# Patient Record
Sex: Female | Born: 1990 | Race: White | Hispanic: No | Smoking: Never smoker
Health system: Southern US, Community
[De-identification: ages and names within clinical notes are randomized; demographics above are authoritative.]

## PROBLEM LIST (undated history)

## (undated) ENCOUNTER — Inpatient Hospital Stay (HOSPITAL_COMMUNITY): Payer: Self-pay

## (undated) DIAGNOSIS — N83209 Unspecified ovarian cyst, unspecified side: Secondary | ICD-10-CM

## (undated) HISTORY — PX: WISDOM TOOTH EXTRACTION: SHX21

---
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
## 2013-11-26 LAB — OB RESULTS CONSOLE ABO/RH: RH TYPE: POSITIVE

## 2013-11-26 LAB — OB RESULTS CONSOLE ANTIBODY SCREEN: Antibody Screen: NEGATIVE

## 2013-11-26 LAB — OB RESULTS CONSOLE HIV ANTIBODY (ROUTINE TESTING): HIV: NONREACTIVE

## 2013-11-26 LAB — OB RESULTS CONSOLE RPR: RPR: NONREACTIVE

## 2013-11-26 LAB — OB RESULTS CONSOLE HEPATITIS B SURFACE ANTIGEN: HEP B S AG: NEGATIVE

## 2013-11-26 LAB — OB RESULTS CONSOLE RUBELLA ANTIBODY, IGM: Rubella: IMMUNE

## 2013-11-28 ENCOUNTER — Encounter (HOSPITAL_COMMUNITY): Payer: Self-pay | Admitting: *Deleted

## 2013-11-28 ENCOUNTER — Inpatient Hospital Stay (HOSPITAL_COMMUNITY)
Admission: AD | Admit: 2013-11-28 | Discharge: 2013-11-28 | Disposition: A | Discharge status: Home / Self Care | Payer: 59 | Source: Ambulatory Visit | Attending: Obstetrics | Admitting: Obstetrics

## 2013-11-28 DIAGNOSIS — O210 Mild hyperemesis gravidarum: Secondary | ICD-10-CM | POA: Insufficient documentation

## 2013-11-28 LAB — URINALYSIS, ROUTINE W REFLEX MICROSCOPIC
Bilirubin Urine: NEGATIVE
GLUCOSE, UA: NEGATIVE mg/dL
HGB URINE DIPSTICK: NEGATIVE
Leukocytes, UA: NEGATIVE
Nitrite: NEGATIVE
PROTEIN: NEGATIVE mg/dL
Specific Gravity, Urine: >1.03 — ABNORMAL HIGH (ref 1.005–1.030)
Urobilinogen, UA: 0.2 mg/dL (ref 0.0–1.0)
pH: 6 (ref 5.0–8.0)

## 2013-11-28 MED ORDER — PROMETHAZINE HCL 25 MG/ML IJ SOLN
25.0000 mg | Freq: Once | INTRAVENOUS | Status: AC
  Administered 2013-11-28: 25 mg via INTRAVENOUS
  Filled 2013-11-28: qty 1

## 2013-11-28 MED ORDER — FAMOTIDINE IN NACL 20-0.9 MG/50ML-% IV SOLN
20.0000 mg | Freq: Once | INTRAVENOUS | Status: AC
  Administered 2013-11-28: 20 mg via INTRAVENOUS
  Filled 2013-11-28: qty 50

## 2013-11-28 MED ORDER — RANITIDINE HCL 150 MG PO TABS: 150.0000 mg | ORAL_TABLET | Freq: Two times a day (BID) | ORAL | Status: DC

## 2013-11-28 MED ORDER — PROMETHAZINE HCL 25 MG RE SUPP: 25.0000 mg | Freq: Four times a day (QID) | RECTAL | Status: DC | PRN

## 2013-11-28 NOTE — Discharge Instructions (Signed)
Hyperemesis Gravidarum  Hyperemesis gravidarum is a severe form of nausea and vomiting that happens during pregnancy. Hyperemesis is worse than morning sickness. It may cause you to have nausea or vomiting all day for many days. It may keep you from eating and drinking enough food and liquids. Hyperemesis usually occurs during the first half (the first 20 weeks) of pregnancy. It often goes away once a woman is in her second half of pregnancy. However, sometimes hyperemesis continues through an entire pregnancy.   CAUSES   The cause of this condition is not completely known but is thought to be related to changes in the body's hormones when pregnant. It could be from the high level of the pregnancy hormone or an increase in estrogen in the body.   SIGNS AND SYMPTOMS   · Severe nausea and vomiting.  · Nausea that does not go away.  · Vomiting that does not allow you to keep any food down.  · Weight loss and body fluid loss (dehydration).  · Having no desire to eat or not liking food you have previously enjoyed.  DIAGNOSIS   Your health care provider will do a physical exam and ask you about your symptoms. He or she may also order blood tests and urine tests to make sure something else is not causing the problem.   TREATMENT   You may only need medicine to control the problem. If medicines do not control the nausea and vomiting, you will be treated in the hospital to prevent dehydration, increased acid in the blood (acidosis), weight loss, and changes in the electrolytes in your body that may harm the unborn baby (fetus). You may need IV fluids.   HOME CARE INSTRUCTIONS   · Only take over-the-counter or prescription medicines as directed by your health care provider.  · Try eating a couple of dry crackers or toast in the morning before getting out of bed.  · Avoid foods and smells that upset your stomach.  · Avoid fatty and spicy foods.  · Eat 5 6 small meals a day.  · Do not drink when eating meals. Drink between  meals.  · For snacks, eat high-protein foods, such as cheese.  · Eat or suck on things that have ginger in them. Ginger helps nausea.  · Avoid food preparation. The smell of food can spoil your appetite.  · Avoid iron pills and iron in your multivitamins until after 3 4 months of being pregnant. However, consult with your health care provider before stopping any prescribed iron pills.  SEEK MEDICAL CARE IF:   · Your abdominal pain increases.  · You have a severe headache.  · You have vision problems.  · You are losing weight.  SEEK IMMEDIATE MEDICAL CARE IF:   · You are unable to keep fluids down.  · You vomit blood.  · You have constant nausea and vomiting.  · You have excessive weakness.  · You have extreme thirst.  · You have dizziness or fainting.  · You have a fever or persistent symptoms for more than 2 3 days.  · You have a fever and your symptoms suddenly get worse.  MAKE SURE YOU:   · Understand these instructions.  · Will watch your condition.  · Will get help right away if you are not doing well or get worse.  Document Released: 07/25/2005 Document Revised: 05/15/2013 Document Reviewed: 03/06/2013  [employer]® Patient Information ©2014 [employer].

## 2013-11-28 NOTE — H&P (Signed)
Chief complaint: Nausea and vomiting  History of present illness: 23 year old G1 at 9 weeks by LMP who presents with one day of persistent nausea and vomiting. Patient has had nausea off and on over the past week. At first tried over-the-counter ginger and vitamin B. When this did not help patient called the office and was prescribed Zofran. Patient took several doses of Premarin with good effect but then was concerned regarding fetal side effects of Zofran and stopped taking this medicine. The patient did not call to seek additional medicine. The patient did well yesterday and tolerated regular dinner. This a.m. patient woke up and initially felt okay but by early morning patient started feeling nausea and began having emesis. Patient reports she has been unable to tolerate any by mouth all day long. She does present at this evening for IV fluids and evaluation. Patient does note constipation. Patient declines heartburn. Patient has not yet been seen by a provider in the office and has not yet had an ultrasound. Patient denies vaginal bleeding or abdominal pain.  Past medical history: None Past surgical history: None allergies: None Medications: Diclegis, prenatal vitamins, rare Zofran use  PE: Filed Vitals:   11/28/13 1929 11/28/13 2318  BP: 123/73 92/47  Pulse: 73 122  Temp: 98.7 F (37.1 C)   TempSrc: Oral   Resp: 18 18  Height: 5\' 2"  (1.575 m)   Weight: 49.442 kg (109 lb)   SpO2: 100%    General: Well-appearing, in no distress Cardiovascular: Regular rate and rhythm Pulmonary: Clear auscultation bilaterally Abdomen soft, nontender, nondistended Back: No costovertebral angle tenderness GU: Deferred Lower extremity: No edema, nontender  Urinalysis    Component Value Date/Time   COLORURINE YELLOW 11/28/2013 1930   APPEARANCEUR CLEAR 11/28/2013 1930   LABSPEC >1.030* 11/28/2013 1930   PHURINE 6.0 11/28/2013 1930   GLUCOSEU NEGATIVE 11/28/2013 1930   HGBUR NEGATIVE 11/28/2013 1930   BILIRUBINUR NEGATIVE 11/28/2013 1930   KETONESUR >80* 11/28/2013 1930   PROTEINUR NEGATIVE 11/28/2013 1930   UROBILINOGEN 0.2 11/28/2013 1930   NITRITE NEGATIVE 11/28/2013 1930   LEUKOCYTESUR NEGATIVE 11/28/2013 1930    MAU course: Patient received 1 L of IV fluids with IV Phenergan and Zantac. She was then observed over several hours time was able to tolerate by mouth liquids and solids. Exam was benign and patient was discharged home with education and additional medicines for at home use  Assessment and plan: 23 year old G1 at 609 weeks by Oswald HillockAlan P. who presents with nausea vomiting and dehydration. Patient is improved on discharge after IV hydration. Discussed with patient risk and benefits of Zofran and recommended limited use of Zofran when unable to tolerate any by mouth in order to avoid trip to hospital for IV hydration. In the future we'll start with higher protein diet, slow hydration, likely just, Phenergan suppository if needed and Zantac. Patient agrees.  Early pregnancy. Patient has appointment on Tuesday for ultrasound to confirm live IUP by ultrasound. No concerning physical exam findings to suggest other wise   Tresa EndoKelly A. Odin Mariani 11/28/2013 11:37 PM

## 2013-11-28 NOTE — MAU Note (Signed)
Pt reports she has been unable to keep anything down today. Has had vomiting through out pregnancy, started new meds yesterday but not working today.

## 2013-12-03 LAB — OB RESULTS CONSOLE GC/CHLAMYDIA
Chlamydia: NEGATIVE
Gonorrhea: NEGATIVE

## 2014-06-09 ENCOUNTER — Encounter (HOSPITAL_COMMUNITY): Payer: Self-pay | Admitting: *Deleted

## 2014-06-25 LAB — OB RESULTS CONSOLE GBS: STREP GROUP B AG: NEGATIVE

## 2014-07-03 ENCOUNTER — Inpatient Hospital Stay (HOSPITAL_COMMUNITY): Payer: 59 | Admitting: Anesthesiology

## 2014-07-03 ENCOUNTER — Encounter (HOSPITAL_COMMUNITY): Payer: Self-pay

## 2014-07-03 ENCOUNTER — Inpatient Hospital Stay (HOSPITAL_COMMUNITY)
Admission: AD | Admit: 2014-07-03 | Discharge: 2014-07-07 | DRG: 765 | Disposition: A | Discharge status: Home / Self Care | Payer: 59 | Source: Ambulatory Visit | Attending: Obstetrics & Gynecology | Admitting: Obstetrics & Gynecology

## 2014-07-03 DIAGNOSIS — O9902 Anemia complicating childbirth: Secondary | ICD-10-CM | POA: Diagnosis present

## 2014-07-03 DIAGNOSIS — O471 False labor at or after 37 completed weeks of gestation: Secondary | ICD-10-CM | POA: Diagnosis present

## 2014-07-03 DIAGNOSIS — O2653 Maternal hypotension syndrome, third trimester: Secondary | ICD-10-CM | POA: Diagnosis not present

## 2014-07-03 DIAGNOSIS — O864 Pyrexia of unknown origin following delivery: Secondary | ICD-10-CM | POA: Diagnosis not present

## 2014-07-03 DIAGNOSIS — Z8249 Family history of ischemic heart disease and other diseases of the circulatory system: Secondary | ICD-10-CM | POA: Diagnosis not present

## 2014-07-03 DIAGNOSIS — Z3A39 39 weeks gestation of pregnancy: Secondary | ICD-10-CM | POA: Diagnosis present

## 2014-07-03 DIAGNOSIS — D62 Acute posthemorrhagic anemia: Secondary | ICD-10-CM | POA: Diagnosis not present

## 2014-07-03 DIAGNOSIS — K219 Gastro-esophageal reflux disease without esophagitis: Secondary | ICD-10-CM | POA: Diagnosis present

## 2014-07-03 DIAGNOSIS — IMO0001 Reserved for inherently not codable concepts without codable children: Secondary | ICD-10-CM

## 2014-07-03 DIAGNOSIS — O99613 Diseases of the digestive system complicating pregnancy, third trimester: Secondary | ICD-10-CM | POA: Diagnosis present

## 2014-07-03 HISTORY — DX: Unspecified ovarian cyst, unspecified side: N83.209

## 2014-07-03 LAB — CBC
HCT: 35.4 % — ABNORMAL LOW (ref 36.0–46.0)
Hemoglobin: 12 g/dL (ref 12.0–15.0)
MCH: 29.6 pg (ref 26.0–34.0)
MCHC: 33.9 g/dL (ref 30.0–36.0)
MCV: 87.2 fL (ref 78.0–100.0)
Platelets: 218 10*3/uL (ref 150–400)
RBC: 4.06 MIL/uL (ref 3.87–5.11)
RDW: 14.5 % (ref 11.5–15.5)
WBC: 18.2 10*3/uL — ABNORMAL HIGH (ref 4.0–10.5)

## 2014-07-03 LAB — TYPE AND SCREEN
ABO/RH(D): A POS
ANTIBODY SCREEN: NEGATIVE

## 2014-07-03 MED ORDER — PHENYLEPHRINE 40 MCG/ML (10ML) SYRINGE FOR IV PUSH (FOR BLOOD PRESSURE SUPPORT)
80.0000 ug | PREFILLED_SYRINGE | INTRAVENOUS | Status: DC | PRN
  Filled 2014-07-03 (×2): qty 10

## 2014-07-03 MED ORDER — FLEET ENEMA 7-19 GM/118ML RE ENEM: 1.0000 | ENEMA | RECTAL | Status: DC | PRN

## 2014-07-03 MED ORDER — OXYCODONE-ACETAMINOPHEN 5-325 MG PO TABS: 1.0000 | ORAL_TABLET | ORAL | Status: DC | PRN

## 2014-07-03 MED ORDER — OXYCODONE-ACETAMINOPHEN 5-325 MG PO TABS: 2.0000 | ORAL_TABLET | ORAL | Status: DC | PRN

## 2014-07-03 MED ORDER — FENTANYL 2.5 MCG/ML BUPIVACAINE 1/10 % EPIDURAL INFUSION (WH - ANES)
14.0000 mL/h | INTRAMUSCULAR | Status: DC | PRN
  Administered 2014-07-03 – 2014-07-04 (×2): 14 mL/h via EPIDURAL
  Filled 2014-07-03 (×2): qty 125

## 2014-07-03 MED ORDER — OXYTOCIN BOLUS FROM INFUSION: 500.0000 mL | INTRAVENOUS | Status: DC

## 2014-07-03 MED ORDER — LACTATED RINGERS IV SOLN
500.0000 mL | INTRAVENOUS | Status: DC | PRN
  Administered 2014-07-04 (×2): 500 mL via INTRAVENOUS

## 2014-07-03 MED ORDER — CITRIC ACID-SODIUM CITRATE 334-500 MG/5ML PO SOLN
30.0000 mL | ORAL | Status: DC | PRN
  Administered 2014-07-04: 30 mL via ORAL
  Filled 2014-07-03: qty 15

## 2014-07-03 MED ORDER — OXYTOCIN 40 UNITS IN LACTATED RINGERS INFUSION - SIMPLE MED: 62.5000 mL/h | INTRAVENOUS | Status: DC

## 2014-07-03 MED ORDER — PHENYLEPHRINE 40 MCG/ML (10ML) SYRINGE FOR IV PUSH (FOR BLOOD PRESSURE SUPPORT)
80.0000 ug | PREFILLED_SYRINGE | INTRAVENOUS | Status: DC | PRN
  Administered 2014-07-04 (×2): 80 ug via INTRAVENOUS

## 2014-07-03 MED ORDER — ONDANSETRON HCL 4 MG/2ML IJ SOLN
4.0000 mg | Freq: Four times a day (QID) | INTRAMUSCULAR | Status: DC | PRN
  Filled 2014-07-03: qty 2

## 2014-07-03 MED ORDER — EPHEDRINE 5 MG/ML INJ: 10.0000 mg | INTRAVENOUS | Status: DC | PRN

## 2014-07-03 MED ORDER — LIDOCAINE HCL (PF) 1 % IJ SOLN
INTRAMUSCULAR | Status: DC | PRN
  Administered 2014-07-03 (×4): 4 mL

## 2014-07-03 MED ORDER — LACTATED RINGERS IV SOLN
INTRAVENOUS | Status: DC
  Administered 2014-07-04 (×3): via INTRAVENOUS

## 2014-07-03 MED ORDER — LACTATED RINGERS IV SOLN
500.0000 mL | Freq: Once | INTRAVENOUS | Status: AC
  Administered 2014-07-03: 500 mL via INTRAVENOUS

## 2014-07-03 MED ORDER — ACETAMINOPHEN 325 MG PO TABS
650.0000 mg | ORAL_TABLET | ORAL | Status: DC | PRN
  Administered 2014-07-04: 650 mg via ORAL
  Filled 2014-07-03: qty 2

## 2014-07-03 MED ORDER — DIPHENHYDRAMINE HCL 50 MG/ML IJ SOLN: 12.5000 mg | INTRAMUSCULAR | Status: DC | PRN

## 2014-07-03 MED ORDER — LIDOCAINE HCL (PF) 1 % IJ SOLN: 30.0000 mL | INTRAMUSCULAR | Status: DC | PRN

## 2014-07-03 NOTE — Anesthesia Procedure Notes (Signed)
Epidural Patient location during procedure: OB Start time: 07/03/2014 10:36 PM  Staffing Anesthesiologist: Shaylie Eklund Performed by: anesthesiologist   Preanesthetic Checklist Completed: patient identified, site marked, surgical consent, pre-op evaluation, timeout performed, IV checked, risks and benefits discussed and monitors and equipment checked  Epidural Patient position: sitting Prep: site prepped and draped and DuraPrep Patient monitoring: continuous pulse ox and blood pressure Approach: midline Location: L3-L4 Injection technique: LOR air  Needle:  Needle type: Tuohy  Needle gauge: 17 G Needle length: 9 cm and 9 Needle insertion depth: 4 cm Catheter type: closed end flexible Catheter size: 19 Gauge Catheter at skin depth: 9 cm Test dose: negative  Assessment Events: blood not aspirated, injection not painful, no injection resistance, negative IV test and no paresthesia  Additional Notes Discussed risk of headache, infection, bleeding, nerve injury and failed or incomplete block.  Patient voices understanding and wishes to proceed.  Epidural placed easily on second attempt (due to positioning/cooperation).  No paresthesia.  Patient tolerated procedure relatively well with support from husband.  No apparent complications.  Jasmine DecemberA. Valton Schwartz, MDReason for block:procedure for pain

## 2014-07-03 NOTE — MAU Note (Signed)
Pt states that she is having contractions every 2 minutes since 1730 today. Denies SROM/LOF, states positive fetal movement, denies bright red bleeding, states positive "spotting". Denies any complications with pregnancy.

## 2014-07-03 NOTE — Anesthesia Preprocedure Evaluation (Signed)
Anesthesia Evaluation  Patient identified by MRN, date of birth, ID band Patient awake    Reviewed: Allergy & Precautions, H&P , NPO status , Patient's Chart, lab work & pertinent test results, reviewed documented beta blocker date and time   History of Anesthesia Complications Negative for: history of anesthetic complications  Airway Mallampati: I  TM Distance: >3 FB Neck ROM: full    Dental  (+) Teeth Intact   Pulmonary neg pulmonary ROS,  breath sounds clear to auscultation        Cardiovascular negative cardio ROS  Rhythm:regular Rate:Normal     Neuro/Psych negative neurological ROS  negative psych ROS   GI/Hepatic negative GI ROS, Neg liver ROS, GERD-  Medicated,  Endo/Other  negative endocrine ROS  Renal/GU negative Renal ROS     Musculoskeletal   Abdominal   Peds  Hematology negative hematology ROS (+)   Anesthesia Other Findings   Reproductive/Obstetrics (+) Pregnancy                             Anesthesia Physical Anesthesia Plan  ASA: II  Anesthesia Plan: Epidural   Post-op Pain Management:    Induction:   Airway Management Planned:   Additional Equipment:   Intra-op Plan:   Post-operative Plan:   Informed Consent: I have reviewed the patients History and Physical, chart, labs and discussed the procedure including the risks, benefits and alternatives for the proposed anesthesia with the patient or authorized representative who has indicated his/her understanding and acceptance.     Plan Discussed with:   Anesthesia Plan Comments:         Anesthesia Quick Evaluation

## 2014-07-03 NOTE — MAU Note (Signed)
[facility] given to Leanne, [facility]. Strip reviewed. Pt may transfer to room 168.

## 2014-07-03 NOTE — MAU Note (Signed)
Pt in room. Pt ambulating to BR

## 2014-07-04 ENCOUNTER — Encounter (HOSPITAL_COMMUNITY): Payer: Self-pay | Admitting: Certified Registered Nurse Anesthetist

## 2014-07-04 ENCOUNTER — Encounter (HOSPITAL_COMMUNITY)
Admission: AD | Disposition: A | Discharge status: Home / Self Care | Payer: Self-pay | Source: Ambulatory Visit | Attending: Obstetrics & Gynecology

## 2014-07-04 DIAGNOSIS — IMO0001 Reserved for inherently not codable concepts without codable children: Secondary | ICD-10-CM

## 2014-07-04 LAB — HIV ANTIBODY (ROUTINE TESTING W REFLEX): HIV 1&2 Ab, 4th Generation: NONREACTIVE

## 2014-07-04 LAB — ABO/RH: ABO/RH(D): A POS

## 2014-07-04 LAB — RPR

## 2014-07-04 SURGERY — Surgical Case
Anesthesia: Epidural

## 2014-07-04 MED ORDER — DEXTROSE 5 % IV SOLN
1.0000 ug/kg/h | INTRAVENOUS | Status: DC | PRN
  Filled 2014-07-04: qty 2

## 2014-07-04 MED ORDER — SODIUM CHLORIDE 0.9 % IJ SOLN
3.0000 mL | INTRAMUSCULAR | Status: DC | PRN
Start: 2014-07-04 — End: 2014-07-05

## 2014-07-04 MED ORDER — OXYTOCIN 40 UNITS IN LACTATED RINGERS INFUSION - SIMPLE MED: 62.5000 mL/h | INTRAVENOUS | Status: DC

## 2014-07-04 MED ORDER — SCOPOLAMINE 1 MG/3DAYS TD PT72
MEDICATED_PATCH | TRANSDERMAL | Status: AC
  Administered 2014-07-04: 1.5 mg via TRANSDERMAL
  Filled 2014-07-04: qty 1

## 2014-07-04 MED ORDER — NALBUPHINE HCL 10 MG/ML IJ SOLN: 5.0000 mg | INTRAMUSCULAR | Status: DC | PRN

## 2014-07-04 MED ORDER — HYDROMORPHONE HCL 1 MG/ML IJ SOLN: 0.2500 mg | INTRAMUSCULAR | Status: DC | PRN

## 2014-07-04 MED ORDER — IBUPROFEN 600 MG PO TABS
600.0000 mg | ORAL_TABLET | Freq: Four times a day (QID) | ORAL | Status: DC
  Administered 2014-07-04 – 2014-07-07 (×12): 600 mg via ORAL
  Filled 2014-07-04 (×12): qty 1

## 2014-07-04 MED ORDER — CEFAZOLIN SODIUM-DEXTROSE 2-3 GM-% IV SOLR
2.0000 g | Freq: Three times a day (TID) | INTRAVENOUS | Status: DC
  Administered 2014-07-04 – 2014-07-05 (×3): 2 g via INTRAVENOUS
  Filled 2014-07-04 (×3): qty 50

## 2014-07-04 MED ORDER — FENTANYL CITRATE 0.05 MG/ML IJ SOLN
INTRAMUSCULAR | Status: DC | PRN
  Administered 2014-07-04: 100 ug via EPIDURAL

## 2014-07-04 MED ORDER — SCOPOLAMINE 1 MG/3DAYS TD PT72
1.0000 | MEDICATED_PATCH | Freq: Once | TRANSDERMAL | Status: AC
  Administered 2014-07-04: 1.5 mg via TRANSDERMAL

## 2014-07-04 MED ORDER — CEFAZOLIN SODIUM-DEXTROSE 2-3 GM-% IV SOLR
INTRAVENOUS | Status: DC | PRN
  Administered 2014-07-04: 2 g via INTRAVENOUS

## 2014-07-04 MED ORDER — SODIUM BICARBONATE 8.4 % IV SOLN
INTRAVENOUS | Status: DC | PRN
  Administered 2014-07-04 (×2): 5 mL via EPIDURAL

## 2014-07-04 MED ORDER — ONDANSETRON HCL 4 MG PO TABS
4.0000 mg | ORAL_TABLET | ORAL | Status: DC | PRN
Start: 2014-07-04 — End: 2014-07-05

## 2014-07-04 MED ORDER — FENTANYL CITRATE 0.05 MG/ML IJ SOLN
INTRAMUSCULAR | Status: AC
  Filled 2014-07-04: qty 2

## 2014-07-04 MED ORDER — PRENATAL MULTIVITAMIN CH
1.0000 | ORAL_TABLET | Freq: Every day | ORAL | Status: DC
  Administered 2014-07-05 – 2014-07-06 (×2): 1 via ORAL
  Filled 2014-07-04 (×2): qty 1

## 2014-07-04 MED ORDER — MEPERIDINE HCL 25 MG/ML IJ SOLN
INTRAMUSCULAR | Status: DC | PRN
  Administered 2014-07-04 (×2): 12.5 mg via INTRAVENOUS

## 2014-07-04 MED ORDER — MORPHINE SULFATE (PF) 0.5 MG/ML IJ SOLN
INTRAMUSCULAR | Status: DC | PRN
  Administered 2014-07-04: 3 mg via EPIDURAL
  Administered 2014-07-04: 2 mg via EPIDURAL

## 2014-07-04 MED ORDER — DIPHENHYDRAMINE HCL 25 MG PO CAPS: 25.0000 mg | ORAL_CAPSULE | ORAL | Status: DC | PRN

## 2014-07-04 MED ORDER — LANOLIN HYDROUS EX OINT: 1.0000 "application " | TOPICAL_OINTMENT | CUTANEOUS | Status: DC | PRN

## 2014-07-04 MED ORDER — KETOROLAC TROMETHAMINE 30 MG/ML IJ SOLN
INTRAMUSCULAR | Status: AC
  Administered 2014-07-04: 30 mg via INTRAMUSCULAR
  Filled 2014-07-04: qty 1

## 2014-07-04 MED ORDER — DIPHENHYDRAMINE HCL 25 MG PO CAPS
25.0000 mg | ORAL_CAPSULE | Freq: Four times a day (QID) | ORAL | Status: DC | PRN
Start: 2014-07-04 — End: 2014-07-07

## 2014-07-04 MED ORDER — SIMETHICONE 80 MG PO CHEW
80.0000 mg | CHEWABLE_TABLET | ORAL | Status: DC
  Administered 2014-07-05 – 2014-07-07 (×3): 80 mg via ORAL
  Filled 2014-07-04 (×3): qty 1

## 2014-07-04 MED ORDER — OXYTOCIN 10 UNIT/ML IJ SOLN
40.0000 [IU] | INTRAVENOUS | Status: DC | PRN
  Administered 2014-07-04: 40 [IU] via INTRAVENOUS

## 2014-07-04 MED ORDER — KETOROLAC TROMETHAMINE 30 MG/ML IJ SOLN: 15.0000 mg | Freq: Once | INTRAMUSCULAR | Status: DC | PRN

## 2014-07-04 MED ORDER — ONDANSETRON HCL 4 MG/2ML IJ SOLN
INTRAMUSCULAR | Status: AC
  Filled 2014-07-04: qty 2

## 2014-07-04 MED ORDER — PHENYLEPHRINE 8 MG IN D5W 100 ML (0.08MG/ML) PREMIX OPTIME
INJECTION | INTRAVENOUS | Status: AC
  Filled 2014-07-04: qty 100

## 2014-07-04 MED ORDER — KETOROLAC TROMETHAMINE 30 MG/ML IJ SOLN
30.0000 mg | Freq: Four times a day (QID) | INTRAMUSCULAR | Status: DC | PRN
  Administered 2014-07-04: 30 mg via INTRAMUSCULAR

## 2014-07-04 MED ORDER — MEPERIDINE HCL 25 MG/ML IJ SOLN
INTRAMUSCULAR | Status: AC
  Filled 2014-07-04: qty 1

## 2014-07-04 MED ORDER — FAMOTIDINE 20 MG PO TABS
20.0000 mg | ORAL_TABLET | Freq: Every day | ORAL | Status: DC
  Administered 2014-07-05 – 2014-07-07 (×3): 20 mg via ORAL
  Filled 2014-07-04 (×3): qty 1

## 2014-07-04 MED ORDER — NALBUPHINE HCL 10 MG/ML IJ SOLN: 5.0000 mg | Freq: Once | INTRAMUSCULAR | Status: AC | PRN

## 2014-07-04 MED ORDER — OXYCODONE-ACETAMINOPHEN 5-325 MG PO TABS
1.0000 | ORAL_TABLET | ORAL | Status: DC | PRN
  Administered 2014-07-05 – 2014-07-07 (×4): 1 via ORAL
  Filled 2014-07-04 (×4): qty 1

## 2014-07-04 MED ORDER — CEFAZOLIN SODIUM-DEXTROSE 2-3 GM-% IV SOLR
2.0000 g | INTRAVENOUS | Status: DC
  Filled 2014-07-04: qty 50

## 2014-07-04 MED ORDER — ONDANSETRON HCL 4 MG/2ML IJ SOLN: 4.0000 mg | INTRAMUSCULAR | Status: DC | PRN

## 2014-07-04 MED ORDER — CHLOROPROCAINE HCL 3 % IJ SOLN
INTRAMUSCULAR | Status: AC
  Filled 2014-07-04: qty 20

## 2014-07-04 MED ORDER — TERBUTALINE SULFATE 1 MG/ML IJ SOLN
INTRAMUSCULAR | Status: AC
  Filled 2014-07-04: qty 1

## 2014-07-04 MED ORDER — WITCH HAZEL-GLYCERIN EX PADS: 1.0000 "application " | MEDICATED_PAD | CUTANEOUS | Status: DC | PRN

## 2014-07-04 MED ORDER — LACTATED RINGERS IV SOLN
INTRAVENOUS | Status: DC | PRN
  Administered 2014-07-04: 10:00:00 via INTRAVENOUS

## 2014-07-04 MED ORDER — DIPHENHYDRAMINE HCL 50 MG/ML IJ SOLN
12.5000 mg | INTRAMUSCULAR | Status: DC | PRN
Start: 2014-07-04 — End: 2014-07-05

## 2014-07-04 MED ORDER — SENNOSIDES-DOCUSATE SODIUM 8.6-50 MG PO TABS
2.0000 | ORAL_TABLET | ORAL | Status: DC
  Administered 2014-07-05 – 2014-07-07 (×3): 2 via ORAL
  Filled 2014-07-04 (×3): qty 2

## 2014-07-04 MED ORDER — OXYCODONE-ACETAMINOPHEN 5-325 MG PO TABS: 2.0000 | ORAL_TABLET | ORAL | Status: DC | PRN

## 2014-07-04 MED ORDER — METHYLERGONOVINE MALEATE 0.2 MG/ML IJ SOLN
INTRAMUSCULAR | Status: DC | PRN
  Administered 2014-07-04: 0.2 mg via INTRAMUSCULAR

## 2014-07-04 MED ORDER — MORPHINE SULFATE 0.5 MG/ML IJ SOLN
INTRAMUSCULAR | Status: AC
  Filled 2014-07-04: qty 10

## 2014-07-04 MED ORDER — CHLOROPROCAINE HCL 3 % IJ SOLN
INTRAMUSCULAR | Status: DC | PRN
  Administered 2014-07-04: 20 mL

## 2014-07-04 MED ORDER — TETANUS-DIPHTH-ACELL PERTUSSIS 5-2.5-18.5 LF-MCG/0.5 IM SUSP: 0.5000 mL | Freq: Once | INTRAMUSCULAR | Status: DC

## 2014-07-04 MED ORDER — MEPERIDINE HCL 25 MG/ML IJ SOLN: 6.2500 mg | INTRAMUSCULAR | Status: DC | PRN

## 2014-07-04 MED ORDER — OXYTOCIN 10 UNIT/ML IJ SOLN
INTRAMUSCULAR | Status: AC
  Filled 2014-07-04: qty 4

## 2014-07-04 MED ORDER — PHENYLEPHRINE HCL 10 MG/ML IJ SOLN
INTRAMUSCULAR | Status: DC | PRN
  Administered 2014-07-04 (×2): 40 ug via INTRAVENOUS

## 2014-07-04 MED ORDER — ONDANSETRON HCL 4 MG/2ML IJ SOLN
INTRAMUSCULAR | Status: DC | PRN
  Administered 2014-07-04: 4 mg via INTRAVENOUS

## 2014-07-04 MED ORDER — IBUPROFEN 600 MG PO TABS: 600.0000 mg | ORAL_TABLET | Freq: Four times a day (QID) | ORAL | Status: DC | PRN

## 2014-07-04 MED ORDER — PROMETHAZINE HCL 25 MG/ML IJ SOLN: 6.2500 mg | INTRAMUSCULAR | Status: DC | PRN

## 2014-07-04 MED ORDER — MENTHOL 3 MG MT LOZG: 1.0000 | LOZENGE | OROMUCOSAL | Status: DC | PRN

## 2014-07-04 MED ORDER — NALOXONE HCL 0.4 MG/ML IJ SOLN: 0.4000 mg | INTRAMUSCULAR | Status: DC | PRN

## 2014-07-04 MED ORDER — ZOLPIDEM TARTRATE 5 MG PO TABS
5.0000 mg | ORAL_TABLET | Freq: Every evening | ORAL | Status: DC | PRN
Start: 2014-07-04 — End: 2014-07-05

## 2014-07-04 MED ORDER — LACTATED RINGERS IV SOLN
INTRAVENOUS | Status: DC
  Administered 2014-07-04: 21:00:00 via INTRAVENOUS

## 2014-07-04 MED ORDER — SIMETHICONE 80 MG PO CHEW: 80.0000 mg | CHEWABLE_TABLET | ORAL | Status: DC | PRN

## 2014-07-04 MED ORDER — KETOROLAC TROMETHAMINE 30 MG/ML IJ SOLN: 30.0000 mg | Freq: Four times a day (QID) | INTRAMUSCULAR | Status: DC | PRN

## 2014-07-04 MED ORDER — DIBUCAINE 1 % RE OINT: 1.0000 "application " | TOPICAL_OINTMENT | RECTAL | Status: DC | PRN

## 2014-07-04 MED ORDER — SIMETHICONE 80 MG PO CHEW
80.0000 mg | CHEWABLE_TABLET | Freq: Three times a day (TID) | ORAL | Status: DC
  Administered 2014-07-04 – 2014-07-07 (×8): 80 mg via ORAL
  Filled 2014-07-04 (×8): qty 1

## 2014-07-04 MED ORDER — ONDANSETRON HCL 4 MG/2ML IJ SOLN
4.0000 mg | Freq: Three times a day (TID) | INTRAMUSCULAR | Status: DC | PRN
Start: 2014-07-04 — End: 2014-07-05

## 2014-07-04 MED ORDER — TERBUTALINE SULFATE 1 MG/ML IJ SOLN
0.2500 mg | Freq: Once | INTRAMUSCULAR | Status: AC
  Administered 2014-07-04: 0.25 mg via SUBCUTANEOUS

## 2014-07-04 SURGICAL SUPPLY — 40 items
APL SKNCLS STERI-STRIP NONHPOA (GAUZE/BANDAGES/DRESSINGS) ×1
BENZOIN TINCTURE PRP APPL 2/3 (GAUZE/BANDAGES/DRESSINGS) ×2 IMPLANT
CLAMP CORD UMBIL (MISCELLANEOUS) IMPLANT
CLOSURE WOUND 1/2 X4 (GAUZE/BANDAGES/DRESSINGS)
CLOTH BEACON ORANGE TIMEOUT ST (SAFETY) ×3 IMPLANT
CONTAINER PREFILL 10% NBF 15ML (MISCELLANEOUS) IMPLANT
DRAPE SHEET LG 3/4 BI-LAMINATE (DRAPES) IMPLANT
DRSG OPSITE POSTOP 4X10 (GAUZE/BANDAGES/DRESSINGS) ×3 IMPLANT
DURAPREP 26ML APPLICATOR (WOUND CARE) ×3 IMPLANT
ELECT REM PT RETURN 9FT ADLT (ELECTROSURGICAL) ×3
ELECTRODE REM PT RTRN 9FT ADLT (ELECTROSURGICAL) ×1 IMPLANT
EXTRACTOR VACUUM KIWI (MISCELLANEOUS) IMPLANT
EXTRACTOR VACUUM M CUP 4 TUBE (SUCTIONS) IMPLANT
EXTRACTOR VACUUM M CUP 4' TUBE (SUCTIONS)
GLOVE BIO SURGEON STRL SZ7 (GLOVE) ×3 IMPLANT
GLOVE BIOGEL PI IND STRL 7.0 (GLOVE) ×1 IMPLANT
GLOVE BIOGEL PI INDICATOR 7.0 (GLOVE) ×2
GOWN STRL REUS W/TWL LRG LVL3 (GOWN DISPOSABLE) ×6 IMPLANT
KIT ABG SYR 3ML LUER SLIP (SYRINGE) IMPLANT
NDL HYPO 25X5/8 SAFETYGLIDE (NEEDLE) IMPLANT
NEEDLE HYPO 25X5/8 SAFETYGLIDE (NEEDLE) IMPLANT
NS IRRIG 1000ML POUR BTL (IV SOLUTION) ×3 IMPLANT
PACK C SECTION WH (CUSTOM PROCEDURE TRAY) ×3 IMPLANT
PAD OB MATERNITY 4.3X12.25 (PERSONAL CARE ITEMS) ×3 IMPLANT
RTRCTR C-SECT PINK 25CM LRG (MISCELLANEOUS) IMPLANT
STAPLER VISISTAT 35W (STAPLE) IMPLANT
STRIP CLOSURE SKIN 1/2X4 (GAUZE/BANDAGES/DRESSINGS) IMPLANT
SUT MON AB-0 CT1 36 (SUTURE) ×9 IMPLANT
SUT PLAIN 0 NONE (SUTURE) IMPLANT
SUT PLAIN 2 0 (SUTURE)
SUT PLAIN ABS 2-0 CT1 27XMFL (SUTURE) IMPLANT
SUT VIC AB 0 CT1 27 (SUTURE) ×6
SUT VIC AB 0 CT1 27XBRD ANBCTR (SUTURE) ×2 IMPLANT
SUT VIC AB 2-0 CT1 27 (SUTURE) ×6
SUT VIC AB 2-0 CT1 TAPERPNT 27 (SUTURE) ×2 IMPLANT
SUT VIC AB 4-0 KS 27 (SUTURE) ×2 IMPLANT
SUT VICRYL 0 TIES 12 18 (SUTURE) IMPLANT
TOWEL OR 17X24 6PK STRL BLUE (TOWEL DISPOSABLE) ×3 IMPLANT
TRAY FOLEY CATH 14FR (SET/KITS/TRAYS/PACK) IMPLANT
WATER STERILE IRR 1000ML POUR (IV SOLUTION) ×3 IMPLANT

## 2014-07-04 NOTE — Op Note (Addendum)
Cesarean Section Procedure Note   Kari Wilkerson 07/04/2014  Indications: 39.5 wks, active labor,  Arrest of dilatation and decent, persistent right OP position   Pre-operative Diagnosis: Primary Cesarean Section for arrest of descent.   Post-operative Diagnosis: Same, Direct occiput posterior position  Surgeon: Robley FriesVaishali R Elisavet Buehrer, MD   Assistants: None   Anesthesia: epidural   Procedure Details:  The patient was seen in the Labor Room. Patient had arrest of decent at -1 station and anterior lip persisted. Cesarean delivery was recommended, the risks, benefits, complications, treatment options, and expected outcomes were discussed with the patient. The patient concurred with the proposed plan, giving informed consent. identified as Kari Wilkerson and the procedure verified as C-Section Delivery. A Time Out was held and the above information confirmed.  After induction of anesthesia, the patient was draped and prepped in the usual sterile manner. Foley catheter was draining well. A Pfannenstiel incision  was made and carried down through the subcutaneous tissue to the fascia. Fascial incision was made and extended transversely. The fascia was separated from the underlying rectus tissue superiorly and inferiorly. The peritoneum was identified and entered. Peritoneal incision was extended longitudinally. Alexis-O retractor was placed.  The utero-vesical peritoneal reflection was incised transversely and a low transverse uterine incision was made. Delivered from direct OP position was a healthy FEMALE infant at 10.22 am on 07/04/2014 with Apgar scores of 8 at one minute and 9 at five minutes. Weight 7 lb 6 oz. Cord clamped and cut, baby handed to NICU team. Cord ph was not sent, cord blood was obtained for evaluation. The placenta was removed Intact and appeared normal. The uterine outline, tubes and ovaries appeared normal. Uterine atony noted despite pitocin, so Methergine 0.2mg  IM given. The uterine  incision was closed with running locked sutures of 0Monocryl followed by a second imbricating layer.    Hemostasis was observed. Alexis retractor was removed. Peritoneal closure done with 2-0 Vicryl.  The fascia was then reapproximated with running sutures of 0Vicryl. The subcuticular closure was performed using 4-0Vicryl. Steri-strips and honeycomb dressing placed.   Instrument, sponge, and needle counts were correct prior the abdominal closure and were correct at the conclusion of the case.   Findings: FEMALE infant delivered from direct OP position at 10.22 am on 07/04/2014 via Sharl MaKerr hysterotomy. Apgars 8 and 9.  2 layer closure of hysterotomy. Uterine atony controlled with IM Methergine. Normal tubes and ovaries.    Estimated Blood Loss: 1200 cc  Total IV Fluids: 2300 cc LR   Urine Output: 1200 ccCC OF clear urine in foley  Specimens: Cord blood, placenta  Complications: no complications  Disposition: PACU - hemodynamically stable.   Maternal Condition: stable   Baby condition / location:  Couplet care / Skin to Skin  Attending Attestation: I performed the procedure.   Signed: Surgeon(s): Robley FriesVaishali R Iasia Forcier, MD

## 2014-07-04 NOTE — Plan of Care (Signed)
Problem: Phase I Progression Outcomes Goal: OOB as tolerated unless otherwise ordered Outcome: Completed/Met Date Met:  07/04/14

## 2014-07-04 NOTE — Progress Notes (Signed)
Patient ID: Kari Wilkerson, female   DOB: [birth year], 23 y.o.   MRN: [id number] Subjective: Doing well,  Epidural working well, denies pelvic pressure  Objective: BP 125/80 mmHg  Pulse 106  Temp(Src) 99.2 F (37.3 C) (Oral)  Resp 18  Ht 5\' 2"  (1.575 m)  Wt 146 lb (66.225 kg)  BMI 26.70 kg/m2  SpO2 100%  FHT:  FHR: 150 bpm, variability: moderate,  accelerations:  Present,  decelerations:  Absent  UC:   regular, every 2 minutes SVE:   Dilation: Lip/rim Effacement (%): 100 Station: -1 Exam by:: Dr. Juliene PinaMody Station unchanged, with only caput in the pelvic inlet. Persists ROT. Persistent luip of cervix and failed decent.   Assessment / Plan: Arrest of decent  Fetal Wellbeing:  Category I Pain Control:  Epidural  Anticipated MOD:  Proceed with  c-section.  Risks/complications of surgery reviewed incl infection, bleeding, damage to internal organs including bladder, bowels, ureters, blood vessels, other risks from anesthesia, VTE and delayed complications of any surgery, complications in future surgery reviewed. Also discussed neonatal complications incl difficult delivery, laceration, vacuum assistance, TTN etc. Pt understands and agrees, all concerns addressed.     Journei Thomassen R 07/04/2014, 9:55 AM

## 2014-07-04 NOTE — Progress Notes (Signed)
[name] is a 23 y.o. G1P0 at [id number], well dated by 1st trimester sono c/w dates. Admitted in labor and has dilated well. But decent is not noted. Baby stays in persistent ROT position with caput at -2-1. UCs by external monitor and are adequate with spontaneous dilatation to 8 cm.  VS stable, low grade temp resolved with Tylenol.  FHT noted change in baseline from 140 to 150s, recurrent variable decels with late component with contractions off and on and resolve. Variability moderate. So category II that resolves to category I.   Watch rotation and decent, narrow pelvis with higher possibility of c-section.   Rai Severns R 07/04/2014, 7:23 AM

## 2014-07-04 NOTE — Plan of Care (Signed)
Problem: Phase II Progression Outcomes Goal: Tolerating diet Outcome: Completed/Met Date Met:  07/04/14     

## 2014-07-04 NOTE — Anesthesia Postprocedure Evaluation (Signed)
  Anesthesia Post-op Note  Patient: Kari Wilkerson  Procedure(s) Performed: Procedure(s): CESAREAN SECTION (N/A)  Patient Location: PACU and Mother/Baby  Anesthesia Type:Epidural  Level of Consciousness: awake, alert , oriented and patient cooperative  Airway and Oxygen Therapy: Patient Spontanous Breathing  Post-op Pain: none  Post-op Assessment: Post-op Vital signs reviewed, Patient's Cardiovascular Status Stable, Respiratory Function Stable, Patent Airway, No signs of Nausea or vomiting, Adequate PO intake, Pain level controlled, No headache, No backache, No residual numbness and No residual motor weakness  Post-op Vital Signs: Reviewed and stable  Last Vitals:  Filed Vitals:   07/04/14 1604  BP: 113/56  Pulse: 96  Temp:   Resp:     Complications: No apparent anesthesia complications

## 2014-07-04 NOTE — Anesthesia Postprocedure Evaluation (Signed)
Anesthesia Post Note  Patient: Kari Wilkerson  Procedure(s) Performed: Procedure(s) (LRB): CESAREAN SECTION (N/A)  Anesthesia type: Epidural  Patient location: PACU  Post pain: Pain level controlled  Post assessment: Post-op Vital signs reviewed  Last Vitals:  Filed Vitals:   07/04/14 1215  BP: 112/71  Pulse: 80  Temp: 37.5 C  Resp: 23    Post vital signs: Reviewed  Level of consciousness: awake  Complications: No apparent anesthesia complications

## 2014-07-04 NOTE — Addendum Note (Signed)
Addendum  created 07/04/14 1726 by Orlie Pollenebra R Finneas Mathe, CRNA   Modules edited: Notes Section   Notes Section:  File: [id number]

## 2014-07-04 NOTE — Plan of Care (Signed)
Problem: Phase I Progression Outcomes Goal: Other Phase I Outcomes/Goals Outcome: Not Applicable Date Met:  91/66/06

## 2014-07-04 NOTE — Progress Notes (Signed)
Patient ID: [name], female   DOB: [birth year], 23 y.o.   MRN: [id number] Came to assess FHT due to repetitive late decels but variability maintained. UCs off and on tachysystole. Terbutaline inj 0.25 x 1 [place] given.  UCs spaced out and FHT improved to 150s/ no decels/ mod variab- category I  Cx swollen per RN, recheck in 2 hrs from last.

## 2014-07-04 NOTE — Lactation Note (Signed)
This note was copied from the chart of Kari Tennis ShipLindsay O'Katherleen Folkes. Lactation Consultation Note  Patient Name: Kari Wilkerson ZOXWR'UToday's Date: 07/04/2014 Reason for consult: Initial assessment of this mom and baby at 10 hours pp.  FOB present and encouraging mom with her breastfeeding.  Baby fed for 45 minutes at initial breastfeeding and both parents report that he latches easily and mom denies any nipple discomfort.  Initial LATCH score assessment=8 per RN and baby has already fed 4 times since delivery.  LC encouraged frequent STS and cue feedings.  Mom says she has been shown hand expression by her nurse.   Mom encouraged to feed baby 8-12 times/24 hours and with feeding cues. LC encouraged review of Baby and Me pp 9, 14 and 20-25 for STS and BF information. LC provided [employer] Resource brochure and reviewed [facility] services and list of community and web site resources.   Maternal Data Formula Feeding for Exclusion: No Has patient been taught Hand Expression?: Yes Does the patient have breastfeeding experience prior to this delivery?: No  Feeding    LATCH Score/Interventions              initial LATCH score=8 per RN assessment        Lactation Tools Discussed/Used   STS, cue feedings, hand expression Signs of proper latch  Consult Status Consult Status: Follow-up Date: 07/05/14 Follow-up type: In-patient    Kari Wilkerson, [facility] Wil[facility]erson [facility] 07/04/2014, 8:55 PM

## 2014-07-04 NOTE — H&P (Addendum)
[name] is a 23 y.o. female presenting at 39.4 wks with contractions, in active labor. Good FMs, no leaking or bleeding. SROM with moderate meconium per RN after admission  Uncomplicated pregnancy.  History OB History    Gravida Para Term Preterm AB TAB SAB Ectopic Multiple Living   1              Past Medical History  Diagnosis Date  . Ovarian cyst    History reviewed. No pertinent past surgical history. Family History: family history includes Hypertension in her father. Social History:  reports that she has never smoked. She has never used smokeless tobacco. She reports that she does not drink alcohol or use illicit drugs.   Prenatal Transfer Tool  Maternal Diabetes: No Genetic Screening: Declined, AFP1 normal  Maternal Ultrasounds/Referrals: Normal Fetal Ultrasounds or other Referrals:  None Maternal Substance Abuse:  No Significant Maternal Medications:  None Significant Maternal Lab Results:  Lab values include: Group B Strep negative Other Comments:  Maternal anemia    ROS  Neg   Dilation: 4 Effacement (%): 100 Station: -2 Exam by:: Dr Juliene PinaMody Blood pressure 94/47, pulse 87, temperature 98.7 F (37.1 C), temperature source Oral, resp. rate 18, height 5\' 2"  (1.575 m), weight 146 lb (66.225 kg), SpO2 100 %. Exam Physical Exam   A&O x 3, no acute distress. Pleasant HEENT neg, no thyromegaly Lungs CTA bilat CV RRR, A1S2 normal Abdo soft, non tender, non acute Extr no edema/ tenderness Pelvic 4/100%/-2/ BBOW (forebag), AROM, bloody fluid. Pelvic narrow FHT  145/ + accels/ no decels/ mod variab, category I Toco q 2 min, spontaneous labor.   Prenatal labs: ABO, Rh: --/--/A POS (11/26 2005) Antibody: NEG (11/26 2005) Rubella: Immune (04/21 0000) RPR: Nonreactive (04/21 0000)  HBsAg: Negative (04/21 0000)  HIV: Non-reactive (04/21 0000)  GBS: Negative (11/18 0000)   Assessment/Plan: 23 yo, G1 at 39.4 wks, in early active labor. Admit, epidural ok. FHT -  category I. EFW 7 lbs, borderline pelvis. Assess progress.    Kari Wilkerson R 07/04/2014, 1:02 AM

## 2014-07-04 NOTE — Transfer of Care (Signed)
Immediate Anesthesia Transfer of Care Note  Patient: Kari Wilkerson  Procedure(s) Performed: Procedure(s): CESAREAN SECTION (N/A)  Patient Location: PACU  Anesthesia Type:Epidural  Level of Consciousness: awake, alert , oriented and patient cooperative  Airway & Oxygen Therapy: Patient Spontanous Breathing  Post-op Assessment: Report given to PACU RN and Post -op Vital signs reviewed and stable  Post vital signs: Reviewed and stable  Complications: No apparent anesthesia complications

## 2014-07-04 NOTE — Plan of Care (Signed)
Problem: Phase I Progression Outcomes Goal: Pain controlled with appropriate interventions Outcome: Completed/Met Date Met:  07/04/14 Goal: Foley catheter patent Outcome: Completed/Met Date Met:  07/04/14 Goal: IS, TCDB as ordered Outcome: Completed/Met Date Met:  07/04/14 Goal: VS, stable, temp < 100.4 degrees F Outcome: Completed/Met Date Met:  07/04/14 Goal: Initial discharge plan identified Outcome: Completed/Met Date Met:  07/04/14

## 2014-07-04 NOTE — Progress Notes (Signed)
Patient ID: Kari Wilkerson, female   DOB: 07-03-1991, 23 y.o.   MRN: [id number] Subjective: Doing well,  Epidural working well, denies pelvic pressure  Objective: BP 133/70 mmHg  Pulse 93  Temp(Src) 99.2 F (37.3 C) (Oral)  Resp 18  Ht 5\' 2"  (1.575 m)  Wt 146 lb (66.225 kg)  BMI 26.70 kg/m2  SpO2 100%  FHT:  FHR: 150 bpm, variability: moderate,  accelerations:  Present,  decelerations:  Absent (decels resolved as long as pt stays on her back and return once she is turned to her side) UC:   regular, every 2 minutes SVE:   Dilation: Lip/rim Effacement (%): 100 Station: -1 Exam by:: Dr. Juliene Wilkerson Station unchanged, with only caput in the pelvic inlet. Persists ROT  Assessment / Plan: Arrest of decent  Fetal Wellbeing:  Category I Pain Control:  Epidural  Anticipated MOD:  Very likely a c-section delivery since almost complete with no decent. Recheck in 1 hr   Kari Wilkerson R 07/04/2014, 8:16 AM

## 2014-07-05 LAB — CBC
HCT: 24.9 % — ABNORMAL LOW (ref 36.0–46.0)
Hemoglobin: 8.4 g/dL — ABNORMAL LOW (ref 12.0–15.0)
MCH: 29.9 pg (ref 26.0–34.0)
MCHC: 33.7 g/dL (ref 30.0–36.0)
MCV: 88.6 fL (ref 78.0–100.0)
PLATELETS: 158 10*3/uL (ref 150–400)
RBC: 2.81 MIL/uL — ABNORMAL LOW (ref 3.87–5.11)
RDW: 15 % (ref 11.5–15.5)
WBC: 20.9 10*3/uL — AB (ref 4.0–10.5)

## 2014-07-05 MED ORDER — LACTATED RINGERS IV SOLN: Freq: Once | INTRAVENOUS | Status: DC

## 2014-07-05 MED ORDER — INFLUENZA VAC SPLIT QUAD 0.5 ML IM SUSY: 0.5000 mL | PREFILLED_SYRINGE | INTRAMUSCULAR | Status: DC | PRN

## 2014-07-05 MED ORDER — MAGNESIUM OXIDE 400 (241.3 MG) MG PO TABS
200.0000 mg | ORAL_TABLET | Freq: Every day | ORAL | Status: DC
  Administered 2014-07-05 – 2014-07-07 (×3): 200 mg via ORAL
  Filled 2014-07-05 (×3): qty 0.5

## 2014-07-05 MED ORDER — POLYSACCHARIDE IRON COMPLEX 150 MG PO CAPS
150.0000 mg | ORAL_CAPSULE | Freq: Every day | ORAL | Status: DC
  Administered 2014-07-05 – 2014-07-07 (×3): 150 mg via ORAL
  Filled 2014-07-05 (×3): qty 1

## 2014-07-05 NOTE — Progress Notes (Signed)
POSTOPERATIVE DAY # 1 S/P C/S for Arrest of dilatation and decent, persistent right OP position    S:        Reports feeling good, some soreness with activity, slept intermittently throughout the night             No dizziness or weakness with standing or walking             Tolerating po intake / no nausea / no vomiting / + flatus / no BM             Bleeding is light             Pain controlled withprescription NSAID's including ibuprofen (Motrin)             Up ad lib / ambulatory/ has voided x 1 since foley has been out   Newborn breast feeding going well/ Circumcision - Dr. Juliene PinaMody planning 11/29             Per RN - Pecola LeisureBaby has low platelets, rechecking platelets and bilirubin today    O:  VS: BP 87/35 mmHg  Pulse 64  Temp(Src) 98.5 F (36.9 C) (Oral)  Resp 18  Ht 5\' 2"  (1.575 m)  Wt 66.225 kg (146 lb)  BMI 26.70 kg/m2  SpO2 98%  Breastfeeding? Unknown   LABS:               Recent Labs  07/03/14 2005 07/05/14 0552  WBC 18.2* 20.9*  HGB 12.0 8.4*  PLT 218 158               Bloodtype: --/--/A POS, A POS (11/26 2005)  Rubella: Immune (04/21 0000)                                             I&O: Intake/Output      11/27 0701 - 11/28 0700 11/28 0701 - 11/29 0700   P.O. 1080 240   I.V. (mL/kg) 2500 (37.8)    Total Intake(mL/kg) 3580 (54.1) 240 (3.6)   Urine (mL/kg/hr) 1960 (1.2) 650 (4.3)   Blood 1250 (0.8)    Total Output 3210 650   Net +370 -410                     Physical Exam:             Alert and Oriented X3  Lungs: Clear and unlabored  Heart: regular rate and rhythm / no mumurs  Abdomen: soft, non-tender, non-distended, active bowel sounds in all 4 quadrants             Fundus: firm, non-tender, U-1             Dressing: honey comb dressing clean, dry, intact              Incision:  approximated with sutures / no erythema / no ecchymosis / no drainage  Perineum: intact  Lochia: scant, red bleeding, no clots noted  Extremities: no edema, no calf pain or  tenderness, negative Homans bilaterally, SCDs on bilaterally  A:        POD # 1 S/P C/S for arrest of dilatation and descent, persistent OP position             ABL Anemia - begin Niferex today  S/P Uterine Atony - Methergine given in OR - stable            Hypotension overnight - 300cc IVF bolus given - stable, not symptomatic            Postpartum Fever - Ancef x 3 doses given - stable, afebrile   P:        Routine postoperative care              Niferex 150mg  daily, increase to BID at discharge x 2 weeks, then daily x 4 weeks              Magnesium Oxide 200mg  daily, then BID at discharge x 2 weeks, then daily x 4 weeks with iron             Offer flu vaccine              D/C antibiotics after last dose of Ancef today, then D/C IV site             Instructed to empty bladder every 2-3 hours to prevent bladder distention              Comfort measures for flatus discussed             Possible discharge tomorrow afternoon                Kari Wilkerson, [place]

## 2014-07-05 NOTE — Progress Notes (Signed)
0222 am  Rn called Genene Churnolitta Davis CNM regarding pt's Blood pressure 87/35 . Pt  Has no vaginal bleeding only a string of blood-really nothing. Orders were given to bolus patient 300ml. Pt ambulated fine and has no dizziness. Pt's uterus is firm and 1 below.  [id number] am RN called Genene Churnolitta Davis CNM regarding Patient's blood pressure of 94/37. No further orders given. Pt  is not dizzy and has no bleeding vaginally. Patient 's uterus is firm and 1 below.

## 2014-07-05 NOTE — Plan of Care (Signed)
Problem: Phase I Progression Outcomes Goal: Assess per MD/Nurse,Routine-VS,FHR,UC,Head to Toe assess Outcome: Completed/Met Date Met:  07/05/14  Problem: Phase II Progression Outcomes Goal: Pain controlled on oral analgesia Outcome: Completed/Met Date Met:  07/05/14 Goal: Progress activity as tolerated unless otherwise ordered Outcome: Completed/Met Date Met:  07/05/14 Goal: Incision intact & without signs/symptoms of infection Outcome: Completed/Met Date Met:  07/05/14 Goal: Other Phase II Outcomes/Goals Outcome: Not Applicable Date Met:  14/78/29

## 2014-07-05 NOTE — Plan of Care (Signed)
Problem: Consults Goal: Postpartum Patient Education (See Patient Education module for education specifics.)  Outcome: Completed/Met Date Met:  07/05/14

## 2014-07-06 NOTE — Plan of Care (Signed)
Problem: Phase I Progression Outcomes Goal: Voiding adequately Outcome: Completed/Met Date Met:  07/06/14  Problem: Phase II Progression Outcomes Goal: Afebrile, VS remain stable Outcome: Completed/Met Date Met:  07/06/14  Problem: Discharge Progression Outcomes Goal: Tolerating diet Outcome: Completed/Met Date Met:  46/65/99 Goal: Complications resolved/controlled Outcome: Not Applicable Date Met:  35/70/17 Goal: Pain controlled with appropriate interventions Outcome: Completed/Met Date Met:  07/06/14

## 2014-07-06 NOTE — Lactation Note (Signed)
This note was copied from the chart of Boy Tennis ShipLindsay O'Bryant. Lactation Consultation Note  Follow up visit at 53 hours of age.  Baby has few 8 times in the past 24 hours.  Mom reports baby just finished feeding.  Baby is now asleep next to mom in the bed with phototherapy.   Mom denies pain with feedings and all questions have been answered.  Parents report being ready to go home and be finished with photo therepy.  Support offered.  Mom knows to call for assist as needed.     Patient Name: Boy Tennis ShipLindsay O'Bryant WGNFA'OToday's Date: 07/06/2014 Reason for consult: Follow-up assessment   Maternal Data Has patient been taught Hand Expression?: Yes  Feeding Feeding Type: Breast Fed Length of feed: 40 min  LATCH Score/Interventions                      Lactation Tools Discussed/Used     Consult Status Consult Status: Follow-up Date: 07/07/14 Follow-up type: In-patient    Jannifer RodneyShoptaw, Lyric Rossano Lynn 07/06/2014, 4:06 PM

## 2014-07-06 NOTE — Progress Notes (Signed)
POSTOPERATIVE DAY # 2 S/P CS  S:         Reports feeling ok - better today only sore             Tolerating po intake / no nausea / no vomiting / no flatus / no BM             Bleeding is light             Pain controlled with motrin mostly             Up ad lib / ambulatory/ voiding QS  Newborn breast and formula feeding  / Circumcision planned today / newborn on bili-lights - no DC home today   O:  VS: BP 92/44 mmHg  Pulse 48  Temp(Src) 97.7 F (36.5 C) (Oral)  Resp 16  Ht 5\' 2"  (1.575 m)  Wt 66.225 kg (146 lb)  BMI 26.70 kg/m2  SpO2 99%  Breastfeeding? Unknown   LABS:               Recent Labs  07/03/14 2005 07/05/14 0552  WBC 18.2* 20.9*  HGB 12.0 8.4*  PLT 218 158               Bloodtype: --/--/A POS, A POS (11/26 2005)  Rubella: Immune (04/21 0000)                                             I&O: Intake/Output      11/28 0701 - 11/29 0700 11/29 0701 - 11/30 0700   P.O. 240    I.V. (mL/kg)     Total Intake(mL/kg) 240 (3.6)    Urine (mL/kg/hr) 1250 (0.8)    Blood     Total Output 1250     Net -1010                       Physical Exam:             Alert and Oriented X3  Lungs: Clear and unlabored  Heart: regular rate and rhythm / no mumurs  Abdomen: soft, non-tender, non-distended active BS             Fundus: firm, non-tender, Ueven             Dressing intact honeycomb              Incision:  approximated with suture / no erythema / no ecchymosis / no drainage  Perineum: intact  Lochia: light spotting  Extremities: no edema, no calf pain or tenderness, negative Homans  A:        POD # 2 S/P CS            ABL anemia on iron and magnesium  P:        Routine postoperative care              Anticipate DC tomorrow or possible rooming-in if infant requires additional Wilkerson day for jaundice   Marlinda MikeBAILEY, Kari Wilkerson CNM, MSN, [facility] HospitalFACNM 07/06/2014, 10:20 AM

## 2014-07-07 ENCOUNTER — Encounter (HOSPITAL_COMMUNITY): Payer: Self-pay | Admitting: Obstetrics & Gynecology

## 2014-07-07 DIAGNOSIS — D62 Acute posthemorrhagic anemia: Secondary | ICD-10-CM | POA: Diagnosis not present

## 2014-07-07 MED ORDER — IBUPROFEN 600 MG PO TABS: 600.0000 mg | ORAL_TABLET | Freq: Four times a day (QID) | ORAL | Status: DC

## 2014-07-07 MED ORDER — OXYCODONE-ACETAMINOPHEN 5-325 MG PO TABS: 1.0000 | ORAL_TABLET | ORAL | Status: DC | PRN

## 2014-07-07 MED ORDER — POLYSACCHARIDE IRON COMPLEX 150 MG PO CAPS: 150.0000 mg | ORAL_CAPSULE | Freq: Two times a day (BID) | ORAL | Status: DC

## 2014-07-07 MED ORDER — MAGNESIUM OXIDE 400 (241.3 MG) MG PO TABS: 200.0000 mg | ORAL_TABLET | Freq: Every day | ORAL | Status: DC

## 2014-07-07 NOTE — Lactation Note (Signed)
This note was copied from the chart of Kari Wilkerson. Lactation Consultation Note  Mother reports that BF is going well.  Baby is sound asleep with his head on her breast.  Her nipples are a bit tender so I suggested and explained an asymmetrical latch to her.  I also explained to her that when her milk comes to volume baby should soften at least one breast.  I suggested pumping if he does not empty to protect her milk supply.  I encouraged support group and reviewed the lactation brochure with her. Patient Name: Kari Wilkerson ZOXWR'UToday's Date: 07/07/2014     Maternal Data    Feeding Feeding Type: Breast Fed Length of feed: 25 min  LATCH Score/Interventions                      Lactation Tools Discussed/Used     Consult Status      Soyla DryerJoseph, Quantia Grullon 07/07/2014, 10:12 AM

## 2014-07-07 NOTE — Plan of Care (Signed)
Problem: Discharge Progression Outcomes Goal: Barriers To Progression Addressed/Resolved Outcome: Completed/Met Date Met:  07/07/14 Goal: Activity appropriate for discharge plan Outcome: Completed/Met Date Met:  07/07/14 Goal: Afebrile, VS remain stable at discharge Outcome: Completed/Met Date Met:  07/07/14 Goal: Discharge plan in place and appropriate Outcome: Completed/Met Date Met:  07/07/14 Goal: Other Discharge Outcomes/Goals Outcome: Completed/Met Date Met:  07/07/14

## 2014-07-07 NOTE — Discharge Summary (Signed)
POSTOPERATIVE DISCHARGE SUMMARY:  Patient ID: Kari Wilkerson MRN: [id number] DOB/AGE: January 30, 1991 23 y.o.  Admit date: 07/03/2014 Admission Diagnoses: [redacted] weeks gestation, SROM, active labor   Discharge date: 07/07/2014 Discharge Diagnoses: S/P C/S on 07/04/2014, ABL anemia        Prenatal history: G1P1000   EDC: 07/06/2014, by Other Basis  [facility] since [redacted] wks gestation. Primary provider: Dr. Juliene PinaMody Prenatal course uncomplicated  Prenatal labs: ABO, Rh: --/--/A POS, A POS (11/26 2005)  Antibody: NEG (11/26 2005) Rubella:   IMMUNE RPR: NON REAC (11/26 2005)  HBsAg: Negative (04/21 0000)  HIV: NONREACTIVE (11/26 2005)  GBS: Negative (11/18 0000)  GTT: 130  Medical / Surgical History :  Past medical history:  Past Medical History  Diagnosis Date  . Ovarian cyst   . Postpartum care following cesarean delivery (11/27) 07/04/2014    Past surgical history: History reviewed. No pertinent past surgical history.   Medications on Admission: Prescriptions prior to admission  Medication Sig Dispense Refill Last Dose  . Prenatal Vit-Fe Fumarate-FA (PRENATAL MULTIVITAMIN) TABS tablet Take 1 tablet by mouth daily at 12 noon.   07/02/2014 at Unknown time  . ranitidine (ZANTAC) 150 MG tablet Take 1 tablet (150 mg total) by mouth 2 (two) times daily. 60 tablet 0 07/03/2014 at Unknown time  . Doxylamine-Pyridoxine 10-10 MG TBEC Take 1-2 tablets by mouth 3 (three) times daily as needed (For nausea.).   More than a month at Unknown time  . promethazine (PHENERGAN) 25 MG suppository Place 1 suppository (25 mg total) rectally every 6 (six) hours as needed for nausea or vomiting. 20 each 0 More than a month at Unknown time    Allergies: Review of patient's allergies indicates no known allergies.   Intrapartum Course:  Admitted for SROM and active labor, epidural, persistant OP, arrest of dilation and descent at 9cm, primary CS.   Postpartum  Course: Complicated by ABL anemia  Physical Exam:   VSS: Blood pressure 115/52, pulse 53, temperature 97.6 F (36.4 C), temperature source Oral, resp. rate 18, height 5\' 2"  (1.575 m), weight 66.225 kg (146 lb), SpO2 99 %, unknown if currently breastfeeding.  LABS:  Recent Labs  07/05/14 0552  WBC 20.9*  HGB 8.4*  PLT 158    General: Alert and oriented x3 Heart: RRR Lungs: CTA bilaterally GI: soft, non-tender, non-distended, BS x4 Lochia: small Uterus: firm below umbilicus Incision: well approximated; honeycomb dressing-no significant erythema, drainage, or edema Extremities: No edema, Homans neg   Newborn Data Live born female  Birth Weight: 7 lb 6.2 oz (3350 g) APGAR: 8, 9  See operative report for further details  Home with mother.  Discharge Instructions:  Wound Care: keep clean and dry / remove honeycomb POD 6 Postpartum Instructions: [facility] discharge booklet - instructions reviewed Medications:    Medication List    STOP taking these medications        Doxylamine-Pyridoxine 10-10 MG Tbec     promethazine 25 MG suppository  Commonly known as:  PHENERGAN     ranitidine 150 MG tablet  Commonly known as:  ZANTAC      TAKE these medications        ibuprofen 600 MG tablet  Commonly known as:  ADVIL,MOTRIN  Take 1 tablet (600 mg total) by mouth every 6 (six) hours.     iron polysaccharides 150 MG capsule  Commonly known as:  NIFEREX  Take 1 capsule (150 mg total) by mouth 2 (two) times  daily.     magnesium oxide 400 (241.3 MG) MG tablet  Commonly known as:  MAG-OX  Take 0.5 tablets (200 mg total) by mouth daily.     oxyCODONE-acetaminophen 5-325 MG per tablet  Commonly known as:  PERCOCET/ROXICET  Take 1-2 tablets by mouth every 4 (four) hours as needed (for pain scale equal to or greater than 7).     prenatal multivitamin Tabs tablet  Take 1 tablet by mouth daily at 12 noon.            Follow-up Information    Follow up with  MODY,VAISHALI R, MD. Schedule an appointment as soon as possible for a visit in 6 weeks.   Specialty:  Obstetrics and Gynecology   Contact information:   [address] [place] [id number] [phone]         Signed: Donette LarryBHAMBRI, Kari Wilkerson, Dorris CarnesN MSN, CNM 07/07/2014, 10:54 AM

## 2014-07-07 NOTE — Progress Notes (Signed)
POD # 3  Subjective: Pt reports feeling well/ Pain controlled with Motrin and Percocet Tolerating po/Voiding without problems/ No n/v/ Flatus present No dizziness or SOB Activity: ad lib Bleeding is light Newborn info:  Information for the patient's newborn:  Evaristo BuryO'Bryant, Boy Aranza [[id number]][[id number]]  female  / Circumcision: done/ Feeding: breast   Objective: VS: VS:  Filed Vitals:   07/05/14 1801 07/06/14 0700 07/06/14 1801 07/07/14 0605  BP: 106/60 92/44 101/51 115/52  Pulse: 85 48 59 53  Temp: 98 F (36.7 C) 97.7 F (36.5 C) 98 F (36.7 C) 97.6 F (36.4 C)  TempSrc: Oral  Oral   Resp: 18 16 18 18   Height:      Weight:      SpO2: 99%       I&O: Intake/Output      11/29 0701 - 11/30 0700 11/30 0701 - 12/01 0700   P.O.     Total Intake(mL/kg)     Urine (mL/kg/hr)     Total Output       Net              LABS:  Recent Labs  07/05/14 0552  WBC 20.9*  HGB 8.4*  PLT 158                           Physical Exam:  General: alert and cooperative CV: Regular rate and rhythm Resp: CTA bilaterally Abdomen: soft, nontender, normal bowel sounds Incision: healing well, no drainage, no erythema, no hernia, no seroma, no swelling, well approximated, honeycomb dsg c/d/i Uterine Fundus: firm, below umbilicus, nontender Lochia: minimal Ext: extremities normal, atraumatic, no cyanosis or edema and Homans sign is negative, no sign of DVT    Assessment: POD # 3/ G1P1000/ S/P C/Section d/t arrest of dilation  ABL anemia Doing well and stable for discharge home  Plan: Discharge home RX's: Ibuprofen 600mg  po Q 6 hrs prn pain #30 Refill x 1 Percocet 5/325 1 - 2 tabs po every 4 hrs prn pain #30 Refill x 0 Niferex 150mg  po BID #60 Refill x 1 [facility] booklet given    Signed: Lawernce PittsBHAMBRI, Avalyn Molino, N, MSN, CNM 07/07/2014, 10:49 AM

## 2015-02-25 ENCOUNTER — Other Ambulatory Visit: Payer: Self-pay | Admitting: Obstetrics and Gynecology

## 2015-02-26 LAB — CYTOLOGY - PAP

## 2015-02-27 LAB — OB RESULTS CONSOLE HEPATITIS B SURFACE ANTIGEN: HEP B S AG: NEGATIVE

## 2015-02-27 LAB — OB RESULTS CONSOLE HIV ANTIBODY (ROUTINE TESTING): HIV: NONREACTIVE

## 2015-02-27 LAB — OB RESULTS CONSOLE ANTIBODY SCREEN: ANTIBODY SCREEN: NEGATIVE

## 2015-02-27 LAB — OB RESULTS CONSOLE GC/CHLAMYDIA
CHLAMYDIA, DNA PROBE: NEGATIVE
GC PROBE AMP, GENITAL: NEGATIVE

## 2015-02-27 LAB — OB RESULTS CONSOLE RUBELLA ANTIBODY, IGM: Rubella: IMMUNE

## 2015-02-27 LAB — OB RESULTS CONSOLE RPR: RPR: NONREACTIVE

## 2015-02-27 LAB — OB RESULTS CONSOLE ABO/RH: RH TYPE: POSITIVE

## 2015-07-19 ENCOUNTER — Encounter (HOSPITAL_COMMUNITY): Payer: Self-pay | Admitting: *Deleted

## 2015-07-19 ENCOUNTER — Inpatient Hospital Stay (HOSPITAL_COMMUNITY)
Admission: AD | Admit: 2015-07-19 | Discharge: 2015-07-19 | Disposition: A | Discharge status: Home / Self Care | Payer: Medicaid Other | Source: Ambulatory Visit | Attending: Obstetrics and Gynecology | Admitting: Obstetrics and Gynecology

## 2015-07-19 DIAGNOSIS — O368130 Decreased fetal movements, third trimester, not applicable or unspecified: Secondary | ICD-10-CM | POA: Insufficient documentation

## 2015-07-19 DIAGNOSIS — O26893 Other specified pregnancy related conditions, third trimester: Secondary | ICD-10-CM | POA: Diagnosis not present

## 2015-07-19 DIAGNOSIS — Z3A36 36 weeks gestation of pregnancy: Secondary | ICD-10-CM | POA: Insufficient documentation

## 2015-07-19 DIAGNOSIS — M549 Dorsalgia, unspecified: Secondary | ICD-10-CM | POA: Diagnosis present

## 2015-07-19 DIAGNOSIS — R102 Pelvic and perineal pain: Secondary | ICD-10-CM | POA: Diagnosis not present

## 2015-07-19 DIAGNOSIS — O4703 False labor before 37 completed weeks of gestation, third trimester: Secondary | ICD-10-CM

## 2015-07-19 DIAGNOSIS — O479 False labor, unspecified: Secondary | ICD-10-CM

## 2015-07-19 LAB — URINALYSIS, ROUTINE W REFLEX MICROSCOPIC
BILIRUBIN URINE: NEGATIVE
GLUCOSE, UA: NEGATIVE mg/dL
HGB URINE DIPSTICK: NEGATIVE
Ketones, ur: NEGATIVE mg/dL
Leukocytes, UA: NEGATIVE
Nitrite: NEGATIVE
Protein, ur: NEGATIVE mg/dL
Specific Gravity, Urine: 1.01 (ref 1.005–1.030)
pH: 6.5 (ref 5.0–8.0)

## 2015-07-19 NOTE — Discharge Instructions (Signed)
Braxton Hicks Contractions °Contractions of the uterus can occur throughout pregnancy. Contractions are not always a sign that you are in labor.  °WHAT ARE BRAXTON HICKS CONTRACTIONS?  °Contractions that occur before labor are called Braxton Hicks contractions, or false labor. Toward the end of pregnancy (32-34 weeks), these contractions can develop more often and may become more forceful. This is not true labor because these contractions do not result in opening (dilatation) and thinning of the cervix. They are sometimes difficult to tell apart from true labor because these contractions can be forceful and people have different pain tolerances. You should not feel embarrassed if you go to the hospital with false labor. Sometimes, the only way to tell if you are in true labor is for your health care provider to look for changes in the cervix. °If there are no prenatal problems or other health problems associated with the pregnancy, it is completely safe to be sent home with false labor and await the onset of true labor. °HOW CAN YOU TELL THE DIFFERENCE BETWEEN TRUE AND FALSE LABOR? °False Labor °· The contractions of false labor are usually shorter and not as hard as those of true labor.   °· The contractions are usually irregular.   °· The contractions are often felt in the front of the lower abdomen and in the groin.   °· The contractions may go away when you walk around or change positions while lying down.   °· The contractions get weaker and are shorter lasting as time goes on.   °· The contractions do not usually become progressively stronger, regular, and closer together as with true labor.   °True Labor °· Contractions in true labor last 30-70 seconds, become very regular, usually become more intense, and increase in frequency.   °· The contractions do not go away with walking.   °· The discomfort is usually felt in the top of the uterus and spreads to the lower abdomen and low back.   °· True labor can be  determined by your health care provider with an exam. This will show that the cervix is dilating and getting thinner.   °WHAT TO REMEMBER °· Keep up with your usual exercises and follow other instructions given by your health care provider.   °· Take medicines as directed by your health care provider.   °· Keep your regular prenatal appointments.   °· Eat and drink lightly if you think you are going into labor.   °· If Braxton Hicks contractions are making you uncomfortable:   °¨ Change your position from lying down or resting to walking, or from walking to resting.   °¨ Sit and rest in a tub of warm water.   °¨ Drink 2-3 glasses of water. Dehydration may cause these contractions.   °¨ Do slow and deep breathing several times an hour.   °WHEN SHOULD I SEEK IMMEDIATE MEDICAL CARE? °Seek immediate medical care if: °· Your contractions become stronger, more regular, and closer together.   °· You have fluid leaking or gushing from your vagina.   °· You have a fever.   °· You pass blood-tinged mucus.   °· You have vaginal bleeding.   °· You have continuous abdominal pain.   °· You have low back pain that you never had before.   °· You feel your baby's head pushing down and causing pelvic pressure.   °· Your baby is not moving as much as it used to.   °  °This information is not intended to replace advice given to you by your health care provider. Make sure you discuss any questions you have with your health care   provider. °  °Document Released: 07/25/2005 Document Revised: 07/30/2013 Document Reviewed: 05/06/2013 °Elsevier Interactive Patient Education ©2016 [employer] ° °

## 2015-07-19 NOTE — MAU Note (Signed)
Pt C/O back & hip pain, pelvic pressure for the last 1-2 hours.  Pain is constant, unsure if contractions.  Denies LOF or bleeding.  Decreased FM.

## 2015-07-19 NOTE — MAU Note (Signed)
Constant back and pelvic pain x 1 hour. Denies contracting, v/d, or leaking of fluid.

## 2015-07-19 NOTE — MAU Provider Note (Signed)
MAU HISTORY AND PHYSICAL  Chief Complaint:  Back Pain and pelvic pressure    Kari Wilkerson is a 24 y.o.  G2P1000 with IUP at [redacted]w[redacted]d presenting for Back Pain and pelvic pressure   Began a couple of hours ago. Constant pain/pressure. Initially decreased fetal movement for a short while but now active fetal movement. No vaginal bleeding or LOF. No fever or chills.   Past Medical History  Diagnosis Date  . Ovarian cyst   . Postpartum care following cesarean delivery (11/27) 07/04/2014    Past Surgical History  Procedure Laterality Date  . Cesarean section N/A 07/04/2014    Procedure: CESAREAN SECTION;  Surgeon: Robley Fries, MD;  Location: [facility];  Service: Obstetrics;  Laterality: N/A;    Family History  Problem Relation Age of Onset  . Hypertension Father     Social History  Substance Use Topics  . Smoking status: Never Smoker   . Smokeless tobacco: Never Used  . Alcohol Use: No    No Known Allergies  Prescriptions prior to admission  Medication Sig Dispense Refill Last Dose  . acetaminophen (TYLENOL) 325 MG tablet Take 325 mg by mouth every 6 (six) hours as needed for mild pain.   07/19/2015 at Unknown time  . ferrous sulfate 325 (65 FE) MG tablet Take 325 mg by mouth daily with breakfast.   07/19/2015 at Unknown time  . Prenatal Vit-Fe Fumarate-FA (PRENATAL MULTIVITAMIN) TABS tablet Take 1 tablet by mouth daily at 12 noon.   07/18/2015 at Unknown time  . ibuprofen (ADVIL,MOTRIN) 600 MG tablet Take 1 tablet (600 mg total) by mouth every 6 (six) hours. (Patient not taking: Reported on 07/19/2015) 30 tablet 0 Not Taking at Unknown time  . iron polysaccharides (NIFEREX) 150 MG capsule Take 1 capsule (150 mg total) by mouth 2 (two) times daily. (Patient not taking: Reported on 07/19/2015) 60 capsule 1 Not Taking at Unknown time  . magnesium oxide (MAG-OX) 400 (241.3 MG) MG tablet Take 0.5 tablets (200 mg total) by mouth daily. (Patient not taking: Reported on 07/19/2015) 30  tablet 1 Completed Course at Unknown time  . oxyCODONE-acetaminophen (PERCOCET/ROXICET) 5-325 MG per tablet Take 1-2 tablets by mouth every 4 (four) hours as needed (for pain scale equal to or greater than 7). (Patient not taking: Reported on 07/19/2015) 30 tablet 0 Completed Course at Unknown time    Review of Systems - Negative except for what is mentioned in HPI.  Physical Exam  Blood pressure 120/71, pulse 89, temperature 98 F (36.7 C), temperature source Oral, resp. rate 18, height  (1.575 m), weight 146 lb (66.225 kg), unknown if currently breastfeeding. GENERAL: Well-developed, well-nourished female in no acute distress.  LUNGS: Clear to auscultation bilaterally.  HEART: Regular rate and rhythm. ABDOMEN: Soft, nontender, nondistended, gravid.  EXTREMITIES: Nontender, no edema, 2+ distal pulses. Cervical Exam: closed/thick/high (RN exam) and same on repeat 2 hours laber   Labs: Results for orders placed or performed during the hospital encounter of 07/19/15 (from the past 24 hour(s))  Urinalysis, Routine w reflex microscopic (not at [facility])   Collection Time: 07/19/15  3:48 PM  Result Value Ref Range   Color, Urine YELLOW YELLOW   APPearance CLEAR CLEAR   Specific Gravity, Urine 1.010 1.005 - 1.030   pH 6.5 5.0 - 8.0   Glucose, UA NEGATIVE NEGATIVE mg/dL   Hgb urine dipstick NEGATIVE NEGATIVE   Bilirubin Urine NEGATIVE NEGATIVE   Ketones, ur NEGATIVE NEGATIVE mg/dL   Protein, ur NEGATIVE  NEGATIVE mg/dL   Nitrite NEGATIVE NEGATIVE   Leukocytes, UA NEGATIVE NEGATIVE    Imaging Studies:  No results found.  Assessment: Kari Wilkerson is  24 y.o. G2P1000 at 1232w2d presents with pelvic pressure. Intermittent ctxns on monitor, pain resolved spontaneously while here, reactive nst, and cervix closed and unchanged on repeat 2 hours later. No s/s abruption or PPROM. Patient with initial concern for 1-2 hours decreased fetal movement, but since admission reports normal fetal  movement, and NST reactive. False labor.  Plan: - home with PTL, PPROM, abruption return precautions, kick counts, and OB f/u 3 days as scheduled  Kari Wilkerson 12/11/20165:50 PM

## 2015-07-22 ENCOUNTER — Other Ambulatory Visit: Payer: Self-pay | Admitting: Obstetrics and Gynecology

## 2015-08-07 ENCOUNTER — Encounter (HOSPITAL_COMMUNITY): Payer: Self-pay

## 2015-08-07 ENCOUNTER — Other Ambulatory Visit: Payer: Self-pay | Admitting: Obstetrics and Gynecology

## 2015-08-07 ENCOUNTER — Encounter (HOSPITAL_COMMUNITY)
Admission: RE | Admit: 2015-08-07 | Discharge: 2015-08-07 | Disposition: A | Discharge status: Home / Self Care | Payer: Medicaid Other | Source: Ambulatory Visit | Attending: Obstetrics and Gynecology | Admitting: Obstetrics and Gynecology

## 2015-08-07 DIAGNOSIS — Z01818 Encounter for other preprocedural examination: Secondary | ICD-10-CM | POA: Insufficient documentation

## 2015-08-07 LAB — TYPE AND SCREEN
ABO/RH(D): A POS
ANTIBODY SCREEN: NEGATIVE

## 2015-08-07 LAB — CBC
HEMATOCRIT: 30.2 % — AB (ref 36.0–46.0)
Hemoglobin: 9.2 g/dL — ABNORMAL LOW (ref 12.0–15.0)
MCH: 23.8 pg — ABNORMAL LOW (ref 26.0–34.0)
MCHC: 30.5 g/dL (ref 30.0–36.0)
MCV: 78 fL (ref 78.0–100.0)
PLATELETS: 269 10*3/uL (ref 150–400)
RBC: 3.87 MIL/uL (ref 3.87–5.11)
RDW: 16.6 % — AB (ref 11.5–15.5)
WBC: 8 10*3/uL (ref 4.0–10.5)

## 2015-08-07 NOTE — H&P (Signed)
24 y.o.  7663w0d    G2P1001 comes in for a repeat cesarean section at term.  Patient has good fetal movement and no bleeding.    Past Medical History  Diagnosis Date  . Ovarian cyst   . Postpartum care following cesarean delivery (11/27) 07/04/2014    Past Surgical History  Procedure Laterality Date  . Cesarean section N/A 07/04/2014    Procedure: CESAREAN SECTION;  Surgeon: Robley FriesVaishali R Mody, MD;  Location: [facility];  Service: Obstetrics;  Laterality: N/A;    OB History  Gravida Para Term Preterm AB SAB TAB Ectopic Multiple Living  2 1 1       0 1    # Outcome Date GA Lbr Len/2nd Weight Sex Delivery Anes PTL Lv  2 Current           1 Term 07/04/14 7539w5d 15:19 / 01:33 3.35 kg (7 lb 6.2 oz) M CS-LTranv EPI        Social History   Social History  . Marital Status: Married    Spouse Name: N/A  . Number of Children: N/A  . Years of Education: N/A   Occupational History  . Not on file.   Social History Main Topics  . Smoking status: Never Smoker   . Smokeless tobacco: Never Used  . Alcohol Use: No  . Drug Use: No  . Sexual Activity: Yes    Birth Control/ Protection: None   Other Topics Concern  . Not on file   Social History Narrative   Review of patient's allergies indicates no known allergies.   Prenatal Course: uncomplicated except mild anemia.   Prenatal Transfer Tool  Maternal Diabetes: No Genetic Screening: Declined Maternal Ultrasounds/Referrals: Normal Fetal Ultrasounds or other Referrals:  None Maternal Substance Abuse:  No Significant Maternal Medications:  None Significant Maternal Lab Results: None   There were no vitals filed for this visit.   Lungs/Cor:  NAD Abdomen:  soft, gravid Ex:  no cords, erythema SVE:  NA FHTs:  present  A/P   For repeat cesarean sectionat term.  All risks, benefits and alternatives discussed with patient and she desires to proceed.  Kari Wilkerson A

## 2015-08-07 NOTE — Patient Instructions (Signed)
Your procedure is scheduled on:   August 11, 2015    Enter through the Main Entrance of [facility] at:  10:30 am   Pick up the phone at the desk and dial [phone].  Call this number if you have problems the morning of surgery: [phone].  Remember: Do NOT eat food: after midnight on Monday  Do NOT drink clear liquids after: 8:00 am day of surgery  Take these medicines the morning of surgery with a SIP OF WATER: none   Do NOT wear jewelry (body piercing), metal hair clips/bobby pins,or nail polish. Do NOT wear lotions, powders, or perfumes.  You may wear deoderant. Do NOT shave for 48 hours prior to surgery. Do NOT bring valuables to the hospital. Leave suitcase in car.  After surgery it may be brought to your room.  For patients admitted to the hospital, checkout time is 11:00 AM the day of discharge.

## 2015-08-08 LAB — RPR: RPR: NONREACTIVE

## 2015-08-10 NOTE — Anesthesia Preprocedure Evaluation (Addendum)
Anesthesia Evaluation  Patient identified by MRN, date of birth, ID band Patient awake    Reviewed: Allergy & Precautions, H&P , Patient's Chart, lab work & pertinent test results  Airway Mallampati: II  TM Distance: >3 FB Neck ROM: full    Dental no notable dental hx.    Pulmonary    Pulmonary exam normal breath sounds clear to auscultation       Cardiovascular Exercise Tolerance: Good  Rhythm:regular Rate:Normal     Neuro/Psych    GI/Hepatic   Endo/Other    Renal/GU      Musculoskeletal   Abdominal   Peds  Hematology  (+) anemia ,   Anesthesia Other Findings   Reproductive/Obstetrics                             Anesthesia Physical Anesthesia Plan  ASA: II  Anesthesia Plan: Spinal   Post-op Pain Management:    Induction:   Airway Management Planned:   Additional Equipment:   Intra-op Plan:   Post-operative Plan:   Informed Consent: I have reviewed the patients History and Physical, chart, labs and discussed the procedure including the risks, benefits and alternatives for the proposed anesthesia with the patient or authorized representative who has indicated his/her understanding and acceptance.   Dental Advisory Given  Plan Discussed with: CRNA  Anesthesia Plan Comments: (Needed 3% 2CP intraperitoneally last time.    Lab work confirmed with CRNA in room. Platelets okay. Discussed spinal anesthetic, and patient consents to the procedure:  included risk of possible headache,backache, failed block, allergic reaction, and nerve injury. This patient was asked if she had any questions or concerns before the procedure started. )        Anesthesia Quick Evaluation

## 2015-08-11 ENCOUNTER — Encounter (HOSPITAL_COMMUNITY): Payer: Self-pay

## 2015-08-11 ENCOUNTER — Inpatient Hospital Stay (HOSPITAL_COMMUNITY)
Admission: RE | Admit: 2015-08-11 | Discharge: 2015-08-13 | DRG: 766 | Disposition: A | Discharge status: Home / Self Care | Payer: Medicaid Other | Source: Ambulatory Visit | Attending: Obstetrics and Gynecology | Admitting: Obstetrics and Gynecology

## 2015-08-11 ENCOUNTER — Inpatient Hospital Stay (HOSPITAL_COMMUNITY): Payer: Medicaid Other | Admitting: Anesthesiology

## 2015-08-11 ENCOUNTER — Encounter (HOSPITAL_COMMUNITY)
Admission: RE | Disposition: A | Discharge status: Home / Self Care | Payer: Self-pay | Source: Ambulatory Visit | Attending: Obstetrics and Gynecology

## 2015-08-11 DIAGNOSIS — Z9889 Other specified postprocedural states: Secondary | ICD-10-CM

## 2015-08-11 DIAGNOSIS — D649 Anemia, unspecified: Secondary | ICD-10-CM | POA: Diagnosis present

## 2015-08-11 DIAGNOSIS — O34211 Maternal care for low transverse scar from previous cesarean delivery: Secondary | ICD-10-CM | POA: Diagnosis present

## 2015-08-11 DIAGNOSIS — O9902 Anemia complicating childbirth: Secondary | ICD-10-CM | POA: Diagnosis present

## 2015-08-11 DIAGNOSIS — Z3A39 39 weeks gestation of pregnancy: Secondary | ICD-10-CM | POA: Diagnosis not present

## 2015-08-11 LAB — TYPE AND SCREEN
ABO/RH(D): A POS
ANTIBODY SCREEN: NEGATIVE

## 2015-08-11 SURGERY — Surgical Case
Anesthesia: Spinal | Site: Abdomen

## 2015-08-11 MED ORDER — CEFAZOLIN SODIUM-DEXTROSE 2-3 GM-% IV SOLR
2.0000 g | INTRAVENOUS | Status: AC
  Administered 2015-08-11: 2 g via INTRAVENOUS

## 2015-08-11 MED ORDER — OXYTOCIN 10 UNIT/ML IJ SOLN: 2.5000 [IU]/h | INTRAVENOUS | Status: AC

## 2015-08-11 MED ORDER — ZOLPIDEM TARTRATE 5 MG PO TABS: 5.0000 mg | ORAL_TABLET | Freq: Every evening | ORAL | Status: DC | PRN

## 2015-08-11 MED ORDER — SIMETHICONE 80 MG PO CHEW
80.0000 mg | CHEWABLE_TABLET | ORAL | Status: DC | PRN
  Filled 2015-08-11: qty 1

## 2015-08-11 MED ORDER — PRENATAL MULTIVITAMIN CH
1.0000 | ORAL_TABLET | Freq: Every day | ORAL | Status: DC
  Administered 2015-08-12 – 2015-08-13 (×2): 1 via ORAL
  Filled 2015-08-11 (×2): qty 1

## 2015-08-11 MED ORDER — ONDANSETRON HCL 4 MG/2ML IJ SOLN
INTRAMUSCULAR | Status: AC
  Filled 2015-08-11: qty 2

## 2015-08-11 MED ORDER — MORPHINE SULFATE (PF) 0.5 MG/ML IJ SOLN
INTRAMUSCULAR | Status: DC | PRN
  Administered 2015-08-11: .1 mg via INTRATHECAL

## 2015-08-11 MED ORDER — PHENYLEPHRINE 8 MG IN D5W 100 ML (0.08MG/ML) PREMIX OPTIME
INJECTION | INTRAVENOUS | Status: DC | PRN
  Administered 2015-08-11: 60 ug/min via INTRAVENOUS

## 2015-08-11 MED ORDER — KETOROLAC TROMETHAMINE 30 MG/ML IJ SOLN
30.0000 mg | Freq: Once | INTRAMUSCULAR | Status: AC
  Administered 2015-08-11: 30 mg via INTRAMUSCULAR

## 2015-08-11 MED ORDER — ONDANSETRON HCL 4 MG/2ML IJ SOLN
INTRAMUSCULAR | Status: DC | PRN
  Administered 2015-08-11: 4 mg via INTRAVENOUS

## 2015-08-11 MED ORDER — ACETAMINOPHEN 325 MG PO TABS
650.0000 mg | ORAL_TABLET | ORAL | Status: DC | PRN
  Administered 2015-08-11 – 2015-08-12 (×3): 650 mg via ORAL
  Filled 2015-08-11 (×3): qty 2

## 2015-08-11 MED ORDER — SIMETHICONE 80 MG PO CHEW
80.0000 mg | CHEWABLE_TABLET | Freq: Three times a day (TID) | ORAL | Status: DC
  Administered 2015-08-11 – 2015-08-13 (×4): 80 mg via ORAL
  Filled 2015-08-11 (×5): qty 1

## 2015-08-11 MED ORDER — SCOPOLAMINE 1 MG/3DAYS TD PT72
MEDICATED_PATCH | TRANSDERMAL | Status: AC
  Administered 2015-08-11: 1.5 mg via TRANSDERMAL
  Filled 2015-08-11: qty 1

## 2015-08-11 MED ORDER — TETANUS-DIPHTH-ACELL PERTUSSIS 5-2.5-18.5 LF-MCG/0.5 IM SUSP: 0.5000 mL | Freq: Once | INTRAMUSCULAR | Status: DC

## 2015-08-11 MED ORDER — PHENYLEPHRINE 8 MG IN D5W 100 ML (0.08MG/ML) PREMIX OPTIME
INJECTION | INTRAVENOUS | Status: AC
  Filled 2015-08-11: qty 100

## 2015-08-11 MED ORDER — SIMETHICONE 80 MG PO CHEW
80.0000 mg | CHEWABLE_TABLET | ORAL | Status: DC
  Administered 2015-08-12 (×2): 80 mg via ORAL
  Filled 2015-08-11 (×2): qty 1

## 2015-08-11 MED ORDER — SCOPOLAMINE 1 MG/3DAYS TD PT72
1.0000 | MEDICATED_PATCH | Freq: Once | TRANSDERMAL | Status: DC
  Administered 2015-08-11: 1.5 mg via TRANSDERMAL

## 2015-08-11 MED ORDER — WITCH HAZEL-GLYCERIN EX PADS: 1.0000 "application " | MEDICATED_PAD | CUTANEOUS | Status: DC | PRN

## 2015-08-11 MED ORDER — LACTATED RINGERS IV SOLN
INTRAVENOUS | Status: DC
  Administered 2015-08-12: 01:00:00 via INTRAVENOUS

## 2015-08-11 MED ORDER — LANOLIN HYDROUS EX OINT: 1.0000 "application " | TOPICAL_OINTMENT | CUTANEOUS | Status: DC | PRN

## 2015-08-11 MED ORDER — OXYCODONE-ACETAMINOPHEN 5-325 MG PO TABS
2.0000 | ORAL_TABLET | ORAL | Status: DC | PRN
  Administered 2015-08-12: 2 via ORAL
  Filled 2015-08-11: qty 2

## 2015-08-11 MED ORDER — OXYCODONE-ACETAMINOPHEN 5-325 MG PO TABS
1.0000 | ORAL_TABLET | ORAL | Status: DC | PRN
  Administered 2015-08-12: 1 via ORAL
  Filled 2015-08-11: qty 1

## 2015-08-11 MED ORDER — DIPHENHYDRAMINE HCL 25 MG PO CAPS: 25.0000 mg | ORAL_CAPSULE | Freq: Four times a day (QID) | ORAL | Status: DC | PRN

## 2015-08-11 MED ORDER — IBUPROFEN 600 MG PO TABS
600.0000 mg | ORAL_TABLET | Freq: Four times a day (QID) | ORAL | Status: DC
  Administered 2015-08-12 – 2015-08-13 (×7): 600 mg via ORAL
  Filled 2015-08-11 (×8): qty 1

## 2015-08-11 MED ORDER — METHYLERGONOVINE MALEATE 0.2 MG/ML IJ SOLN: 0.2000 mg | INTRAMUSCULAR | Status: DC | PRN

## 2015-08-11 MED ORDER — FENTANYL CITRATE (PF) 100 MCG/2ML IJ SOLN
INTRAMUSCULAR | Status: DC | PRN
  Administered 2015-08-11: 25 ug via INTRATHECAL
  Administered 2015-08-11: 50 ug via INTRAVENOUS

## 2015-08-11 MED ORDER — LACTATED RINGERS IV SOLN
INTRAVENOUS | Status: DC
  Administered 2015-08-11: 12:00:00 via INTRAVENOUS

## 2015-08-11 MED ORDER — LACTATED RINGERS IV SOLN
Freq: Once | INTRAVENOUS | Status: AC
  Administered 2015-08-11: 1000 mL/h via INTRAVENOUS

## 2015-08-11 MED ORDER — SENNOSIDES-DOCUSATE SODIUM 8.6-50 MG PO TABS
2.0000 | ORAL_TABLET | ORAL | Status: DC
  Administered 2015-08-12 (×2): 2 via ORAL
  Filled 2015-08-11 (×2): qty 2

## 2015-08-11 MED ORDER — BISACODYL 10 MG RE SUPP: 10.0000 mg | Freq: Every day | RECTAL | Status: DC | PRN

## 2015-08-11 MED ORDER — LACTATED RINGERS IV SOLN
INTRAVENOUS | Status: DC | PRN
  Administered 2015-08-11 (×2): via INTRAVENOUS

## 2015-08-11 MED ORDER — DIBUCAINE 1 % RE OINT: 1.0000 "application " | TOPICAL_OINTMENT | RECTAL | Status: DC | PRN

## 2015-08-11 MED ORDER — MENTHOL 3 MG MT LOZG: 1.0000 | LOZENGE | OROMUCOSAL | Status: DC | PRN

## 2015-08-11 MED ORDER — CEFAZOLIN SODIUM-DEXTROSE 2-3 GM-% IV SOLR
INTRAVENOUS | Status: AC
  Filled 2015-08-11: qty 50

## 2015-08-11 MED ORDER — FENTANYL CITRATE (PF) 100 MCG/2ML IJ SOLN
INTRAMUSCULAR | Status: AC
  Filled 2015-08-11: qty 2

## 2015-08-11 MED ORDER — FERROUS SULFATE 325 (65 FE) MG PO TABS
325.0000 mg | ORAL_TABLET | Freq: Two times a day (BID) | ORAL | Status: DC
  Administered 2015-08-11 – 2015-08-13 (×4): 325 mg via ORAL
  Filled 2015-08-11 (×4): qty 1

## 2015-08-11 MED ORDER — OXYTOCIN 10 UNIT/ML IJ SOLN
INTRAMUSCULAR | Status: AC
  Filled 2015-08-11: qty 4

## 2015-08-11 MED ORDER — KETOROLAC TROMETHAMINE 30 MG/ML IJ SOLN
INTRAMUSCULAR | Status: AC
  Filled 2015-08-11: qty 1

## 2015-08-11 MED ORDER — MEASLES, MUMPS & RUBELLA VAC ~~LOC~~ INJ: 0.5000 mL | INJECTION | Freq: Once | SUBCUTANEOUS | Status: DC

## 2015-08-11 MED ORDER — FLEET ENEMA 7-19 GM/118ML RE ENEM: 1.0000 | ENEMA | Freq: Every day | RECTAL | Status: DC | PRN

## 2015-08-11 MED ORDER — OXYTOCIN 10 UNIT/ML IJ SOLN
40.0000 [IU] | INTRAMUSCULAR | Status: DC | PRN
  Administered 2015-08-11: 40 [IU] via INTRAVENOUS

## 2015-08-11 MED ORDER — MORPHINE SULFATE (PF) 0.5 MG/ML IJ SOLN
INTRAMUSCULAR | Status: AC
  Filled 2015-08-11: qty 10

## 2015-08-11 MED ORDER — METHYLERGONOVINE MALEATE 0.2 MG PO TABS: 0.2000 mg | ORAL_TABLET | ORAL | Status: DC | PRN

## 2015-08-11 SURGICAL SUPPLY — 39 items
APL SKNCLS STERI-STRIP NONHPOA (GAUZE/BANDAGES/DRESSINGS) ×1
BENZOIN TINCTURE PRP APPL 2/3 (GAUZE/BANDAGES/DRESSINGS) ×3 IMPLANT
CLAMP CORD UMBIL (MISCELLANEOUS) IMPLANT
CLOSURE WOUND 1/2 X4 (GAUZE/BANDAGES/DRESSINGS) ×1
CLOTH BEACON ORANGE TIMEOUT ST (SAFETY) ×3 IMPLANT
DRAPE SHEET LG 3/4 BI-LAMINATE (DRAPES) IMPLANT
DRSG OPSITE POSTOP 4X10 (GAUZE/BANDAGES/DRESSINGS) ×3 IMPLANT
DURAPREP 26ML APPLICATOR (WOUND CARE) ×3 IMPLANT
ELECT REM PT RETURN 9FT ADLT (ELECTROSURGICAL) ×3
ELECTRODE REM PT RTRN 9FT ADLT (ELECTROSURGICAL) ×1 IMPLANT
EXTRACTOR VACUUM BELL STYLE (SUCTIONS) IMPLANT
GLOVE BIO SURGEON STRL SZ7 (GLOVE) ×3 IMPLANT
GLOVE BIOGEL PI IND STRL 7.0 (GLOVE) ×1 IMPLANT
GLOVE BIOGEL PI INDICATOR 7.0 (GLOVE) ×2
GOWN STRL REUS W/TWL LRG LVL3 (GOWN DISPOSABLE) ×6 IMPLANT
KIT ABG SYR 3ML LUER SLIP (SYRINGE) IMPLANT
NDL HYPO 25X5/8 SAFETYGLIDE (NEEDLE) IMPLANT
NEEDLE HYPO 25X5/8 SAFETYGLIDE (NEEDLE) IMPLANT
NS IRRIG 1000ML POUR BTL (IV SOLUTION) ×3 IMPLANT
PACK C SECTION WH (CUSTOM PROCEDURE TRAY) ×3 IMPLANT
PAD OB MATERNITY 4.3X12.25 (PERSONAL CARE ITEMS) ×3 IMPLANT
PENCIL SMOKE EVAC W/HOLSTER (ELECTROSURGICAL) ×3 IMPLANT
RTRCTR C-SECT PINK 25CM LRG (MISCELLANEOUS) ×3 IMPLANT
STRIP CLOSURE SKIN 1/2X4 (GAUZE/BANDAGES/DRESSINGS) ×2 IMPLANT
STRIP CLOSURE SKIN 1X5 (GAUZE/BANDAGES/DRESSINGS) ×1 IMPLANT
STRIP CLOSURE STERI-STRIP 1X5 (GAUZE/BANDAGES/DRESSINGS) ×1
SUT MNCRL 0 VIOLET CTX 36 (SUTURE) ×2 IMPLANT
SUT MONOCRYL 0 CTX 36 (SUTURE) ×4
SUT PDS AB 0 CTX 60 (SUTURE) IMPLANT
SUT PLAIN 2 0 (SUTURE) ×3
SUT PLAIN 2 0 XLH (SUTURE) IMPLANT
SUT PLAIN ABS 2-0 CT1 27XMFL (SUTURE) IMPLANT
SUT VIC AB 0 CT1 27 (SUTURE) ×6
SUT VIC AB 0 CT1 27XBRD ANBCTR (SUTURE) ×2 IMPLANT
SUT VIC AB 2-0 CT1 27 (SUTURE) ×3
SUT VIC AB 2-0 CT1 TAPERPNT 27 (SUTURE) ×1 IMPLANT
SUT VIC AB 4-0 KS 27 (SUTURE) ×3 IMPLANT
TOWEL OR 17X24 6PK STRL BLUE (TOWEL DISPOSABLE) ×3 IMPLANT
TRAY FOLEY CATH SILVER 14FR (SET/KITS/TRAYS/PACK) ×3 IMPLANT

## 2015-08-11 NOTE — Anesthesia Postprocedure Evaluation (Signed)
Anesthesia Post Note  Patient: Kari Wilkerson  Procedure(s) Performed: Procedure(s) (LRB): CESAREAN SECTION (N/A)  Patient location during evaluation: PACU Anesthesia Type: Spinal Level of consciousness: awake Pain management: satisfactory to patient Vital Signs Assessment: post-procedure vital signs reviewed and stable Respiratory status: spontaneous breathing Cardiovascular status: blood pressure returned to baseline Postop Assessment: no headache and spinal receding Anesthetic complications: no    Last Vitals:  Filed Vitals:   08/11/15 1500 08/11/15 1608  BP: 114/55 114/60  Pulse: 56 61  Temp: 36.4 C 36.8 C  Resp: 18 18    Last Pain:  Filed Vitals:   08/11/15 1613  PainSc: 3                  Tashianna Broome EDWARD

## 2015-08-11 NOTE — Consult Note (Signed)
[facility]  Delivery Note:  C-section       08/11/2015  12:16 PM  I was called to the operating room at the request of the patient's obstetrician (Dr. Henderson CloudHorvath) for a repeat c-section.  PRENATAL HX:  25 y/o G2P1001 at 39 weeks admitted for a repeat c-section.    INTRAPARTUM HX:   Repeat c-section with AROM at delivery  DELIVERY:  Infant was vigorous at delivery, requiring no resuscitation other than standard warming, drying and stimulation.  APGARs 8 and 9.  Exam within normal limits.  After 5 minutes, baby left with nurse to assist parents with skin-to-skin care.   _____________________ Electronically Signed By: Kari CharLindsey Yuya Vanwingerden, MD Neonatologist

## 2015-08-11 NOTE — Op Note (Signed)
08/11/2015  12:36 PM  PATIENT:  Kari ChampagneLindsay T O'Bryant  25 y.o. female  PRE-OPERATIVE DIAGNOSIS:  REPEAT EDDa: 08/14/15, NR HIV, NKDA  POST-OPERATIVE DIAGNOSIS:  REPEAT EDDa: 08/14/15, NR HIV, NKDA  PROCEDURE:  Procedure(s): CESAREAN SECTION (N/A)  SURGEON:  Surgeon(s) and Role:    * Carrington ClampMichelle Yulissa Needham, MD - Primary    ANESTHESIA:   spinal  EBL:  Total I/O In: 2600 [I.V.:2600] Out: 950 [Urine:150; Blood:800]  SPECIMEN:  No Specimen  DISPOSITION OF SPECIMEN:  PATHOLOGY  COUNTS:  YES  TOURNIQUET:  * No tourniquets in log *  DICTATION: .Note written in EPIC  PLAN OF CARE: Admit to inpatient   PATIENT DISPOSITION:  PACU - hemodynamically stable.   Delay start of Pharmacological VTE agent (>24hrs) due to surgical blood loss or risk of bleeding: not applicable  Complications:  none Medications:  Ancef, Pitocin Findings:  Baby female, Apgars 8,9, weight P.   Normal tubes, ovaries and uterus seen.  Baby was skin to skin with mother after birth in the OR.  Technique:  After adequate spinal anesthesia was achieved, the patient was prepped and draped in usual sterile fashion.  A foley catheter was used to drain the bladder.  A pfannanstiel incision was made with the scalpel and carried down to the fascia with the bovie cautery. The fascia was incised in the midline with the scalpel and carried in a transverse curvilinear manner bilaterally.  The fascia was reflected superiorly and inferiorly off the rectus muscles and the muscles split in the midline.  A bowel free portion of the peritoneum was entered bluntly and then extended in a superior and inferior manner with good visualization of the bowel and bladder.  The Alexis instrument was then placed and the vesico-uterine fascia tented up and incised in a transverse curvilinear manner.  A 2 cm transverse incision was made in the upper portion of the lower uterine segment until the amnion was exposed.   The incision was extended transversely  in a blunt manner.  Clear fluid was noted and the baby delivered in the vertex presentation without complication.  The baby was bulb suctioned and the cord was clamped and cut.  The baby was then handed to awaiting Neonatology.  The placenta was then delivered manually and the uterus cleared of all debris.  The uterine incision was then closed with a running lock stitch of 0 monocryl.  An imbricating layer of 0 monocryl was closed as well. Excellent hemostasis of the uterine incision was achieved and the abdomen was cleared with irrigation.  The peritoneum was closed with a running stitch of 2-0 vicryl.  This incorporated the rectus muscles as a separate layer.  The fascia was then closed with a running stitch of 0 vicryl.  The subcutaneous layer was closed with interrupted  stitches of 2-0 plain gut.  The skin was closed with 4-0 vicryl on a Keith needle and steri-strips.  The patient tolerated the procedure well and was returned to the recovery room in stable condition.  All counts were correct times three.  Zamora Colton A

## 2015-08-11 NOTE — Transfer of Care (Signed)
Immediate Anesthesia Transfer of Care Note  Patient: Kari Wilkerson  Procedure(s) Performed: Procedure(s): CESAREAN SECTION (N/A)  Patient Location: PACU  Anesthesia Type:Spinal  Level of Consciousness: awake, alert , oriented and patient cooperative  Airway & Oxygen Therapy: Patient Spontanous Breathing  Post-op Assessment: Report given to RN and Post -op Vital signs reviewed and stable  Post vital signs: Reviewed and stable  Last Vitals:  Filed Vitals:   08/11/15 1248 08/11/15 1249  BP:    Pulse: 77 74  Temp:    Resp: 18 15    Complications: No apparent anesthesia complications

## 2015-08-11 NOTE — Brief Op Note (Signed)
08/11/2015  12:36 PM  PATIENT:  Kari ChampagneLindsay T O'Bryant  25 y.o. female  PRE-OPERATIVE DIAGNOSIS:  REPEAT EDDa: 08/14/15, NR HIV, NKDA  POST-OPERATIVE DIAGNOSIS:  REPEAT EDDa: 08/14/15, NR HIV, NKDA  PROCEDURE:  Procedure(s): CESAREAN SECTION (N/A)  SURGEON:  Surgeon(s) and Role:    * Carrington ClampMichelle Avarae Zwart, MD - Primary    ANESTHESIA:   spinal  EBL:  Total I/O In: 2600 [I.V.:2600] Out: 950 [Urine:150; Blood:800]  SPECIMEN:  No Specimen  DISPOSITION OF SPECIMEN:  PATHOLOGY  COUNTS:  YES  TOURNIQUET:  * No tourniquets in log *  DICTATION: .Note written in EPIC  PLAN OF CARE: Admit to inpatient   PATIENT DISPOSITION:  PACU - hemodynamically stable.   Delay start of Pharmacological VTE agent (>24hrs) due to surgical blood loss or risk of bleeding: not applicable  Complications:  none Medications:  Ancef, Pitocin Findings:  Baby female, Apgars P, weight P.   Normal tubes, ovaries and uterus seen.  Baby was skin to skin with mother after birth in the OR.  Technique:  After adequate spinal anesthesia was achieved, the patient was prepped and draped in usual sterile fashion.  A foley catheter was used to drain the bladder.  A pfannanstiel incision was made with the scalpel and carried down to the fascia with the bovie cautery. The fascia was incised in the midline with the scalpel and carried in a transverse curvilinear manner bilaterally.  The fascia was reflected superiorly and inferiorly off the rectus muscles and the muscles split in the midline.  A bowel free portion of the peritoneum was entered bluntly and then extended in a superior and inferior manner with good visualization of the bowel and bladder.  The Alexis instrument was then placed and the vesico-uterine fascia tented up and incised in a transverse curvilinear manner.  A 2 cm transverse incision was made in the upper portion of the lower uterine segment until the amnion was exposed.   The incision was extended transversely in  a blunt manner.  Clear fluid was noted and the baby delivered in the vertex presentation without complication.  The baby was bulb suctioned and the cord was clamped and cut.  The baby was then handed to awaiting Neonatology.  The placenta was then delivered manually and the uterus cleared of all debris.  The uterine incision was then closed with a running lock stitch of 0 monocryl.  An imbricating layer of 0 monocryl was closed as well. Excellent hemostasis of the uterine incision was achieved and the abdomen was cleared with irrigation.  The peritoneum was closed with a running stitch of 2-0 vicryl.  This incorporated the rectus muscles as a separate layer.  The fascia was then closed with a running stitch of 0 vicryl.  The subcutaneous layer was closed with interrupted  stitches of 2-0 plain gut.  The skin was closed with 4-0 vicryl on a Keith needle and steri-strips.  The patient tolerated the procedure well and was returned to the recovery room in stable condition.  All counts were correct times three.  Taren Dymek A

## 2015-08-11 NOTE — Progress Notes (Signed)
There has been no change in the patients history, status or exam since the history and physical.  Filed Vitals:   08/11/15 1039  BP: 126/71  Temp: 98.4 F (36.9 C)  TempSrc: Oral  Resp: 16  SpO2: 100%    Lab Results  Component Value Date   WBC 8.0 08/07/2015   HGB 9.2* 08/07/2015   HCT 30.2* 08/07/2015   MCV 78.0 08/07/2015   PLT 269 08/07/2015    Kari Wilkerson

## 2015-08-11 NOTE — Anesthesia Postprocedure Evaluation (Signed)
Anesthesia Post Note  Patient: Kari ChampagneLindsay T Wilkerson  Procedure(s) Performed: Procedure(s) (LRB): CESAREAN SECTION (N/A)  Anesthesia Post Evaluation  Last Vitals:  Filed Vitals:   08/11/15 1500 08/11/15 1608  BP: 114/55 114/60  Pulse: 56 61  Temp: 36.4 C 36.8 C  Resp: 18 18    Last Pain:  Filed Vitals:   08/11/15 1613  PainSc: 3                  Meagan Ancona EDWARD

## 2015-08-11 NOTE — Anesthesia Postprocedure Evaluation (Signed)
Anesthesia Post Note  Patient: Kari Wilkerson  Procedure(s) Performed: Procedure(s) (LRB): CESAREAN SECTION (N/A)  Patient location during evaluation: PACU Anesthesia Type: Spinal Level of consciousness: awake Pain management: pain level controlled Vital Signs Assessment: post-procedure vital signs reviewed and stable Respiratory status: spontaneous breathing Cardiovascular status: blood pressure returned to baseline Postop Assessment: spinal receding Anesthetic complications: no    Last Vitals:  Filed Vitals:   08/11/15 1500 08/11/15 1608  BP: 114/55 114/60  Pulse: 56 61  Temp: 36.4 C 36.8 C  Resp: 18 18    Last Pain:  Filed Vitals:   08/11/15 1613  PainSc: 3                  Erubiel Manasco EDWARD

## 2015-08-11 NOTE — Anesthesia Procedure Notes (Signed)

## 2015-08-11 NOTE — Lactation Note (Signed)
This note was copied from the chart of Boy Tennis ShipLindsay O'Bryant. Lactation Consultation Note  Patient Name: Boy Tennis ShipLindsay O'Bryant ZOXWR'UToday's Date: 08/11/2015 Reason for consult: Initial assessment  Baby 5 hours old. Mom reports that she nursed her first child for 6 weeks without any issues. Mom has a room full of visitors who are holding the baby. Mom states that she nursed the baby for 10 minutes and will nurse again after her company leaves. Enc mom to offer lots of STS and nurse with cues. [facility] given [facility] broch[facility], aware of OP/BFSG and LC phone line assistance after D/C.  Maternal Data Does the patient have breastfeeding experience prior to this delivery?: Yes  Feeding Feeding Type: Breast Fed  LATCH Score/Interventions                      Lactation Tools Discussed/Used     Consult Status Consult Status: Follow-up Date: 08/12/15 Follow-up type: In-patient    Geralynn OchsWILLIARD, Lillia Lengel 08/11/2015, 6:10 PM

## 2015-08-12 ENCOUNTER — Encounter (HOSPITAL_COMMUNITY): Payer: Self-pay | Admitting: Obstetrics and Gynecology

## 2015-08-12 LAB — CBC
HCT: 24.7 % — ABNORMAL LOW (ref 36.0–46.0)
Hemoglobin: 7.6 g/dL — ABNORMAL LOW (ref 12.0–15.0)
MCH: 23.6 pg — AB (ref 26.0–34.0)
MCHC: 30.8 g/dL (ref 30.0–36.0)
MCV: 76.7 fL — AB (ref 78.0–100.0)
PLATELETS: 192 10*3/uL (ref 150–400)
RBC: 3.22 MIL/uL — ABNORMAL LOW (ref 3.87–5.11)
RDW: 16.8 % — AB (ref 11.5–15.5)
WBC: 9.3 10*3/uL (ref 4.0–10.5)

## 2015-08-12 LAB — BIRTH TISSUE RECOVERY COLLECTION (PLACENTA DONATION)

## 2015-08-12 MED ORDER — DIPHENHYDRAMINE HCL 25 MG PO CAPS: 25.0000 mg | ORAL_CAPSULE | ORAL | Status: DC | PRN

## 2015-08-12 MED ORDER — NALOXONE HCL 0.4 MG/ML IJ SOLN: 0.4000 mg | INTRAMUSCULAR | Status: DC | PRN

## 2015-08-12 MED ORDER — ACETAMINOPHEN 500 MG PO TABS: 1000.0000 mg | ORAL_TABLET | Freq: Four times a day (QID) | ORAL | Status: DC

## 2015-08-12 MED ORDER — NALBUPHINE HCL 10 MG/ML IJ SOLN: 5.0000 mg | Freq: Once | INTRAMUSCULAR | Status: DC | PRN

## 2015-08-12 MED ORDER — SODIUM CHLORIDE 0.9 % IJ SOLN: 3.0000 mL | INTRAMUSCULAR | Status: DC | PRN

## 2015-08-12 MED ORDER — ONDANSETRON HCL 4 MG/2ML IJ SOLN: 4.0000 mg | Freq: Three times a day (TID) | INTRAMUSCULAR | Status: DC | PRN

## 2015-08-12 MED ORDER — NALBUPHINE HCL 10 MG/ML IJ SOLN: 5.0000 mg | INTRAMUSCULAR | Status: DC | PRN

## 2015-08-12 MED ORDER — NALOXONE HCL 2 MG/2ML IJ SOSY
1.0000 ug/kg/h | PREFILLED_SYRINGE | INTRAMUSCULAR | Status: DC | PRN
  Filled 2015-08-12: qty 2

## 2015-08-12 MED ORDER — DIPHENHYDRAMINE HCL 50 MG/ML IJ SOLN: 12.5000 mg | INTRAMUSCULAR | Status: DC | PRN

## 2015-08-12 NOTE — Progress Notes (Addendum)
  Patient is eating, ambulating, voiding.  Pain control is good.  Filed Vitals:   08/11/15 1835 08/11/15 1838 08/11/15 2100 08/12/15 0500  BP: 109/46 105/59 98/42 88/34   Pulse: 82 87 68 64  Temp:   98.2 F (36.8 C) 97.9 F (36.6 C)  TempSrc:   Oral Oral  Resp:   20 18  SpO2:   98% 98%    lungs:   clear to auscultation cor:    RRR Abdomen:  soft, appropriate tenderness, incisions intact and without erythema or exudate ex:    no cords   Lab Results  Component Value Date   WBC 9.3 08/12/2015   HGB 7.6* 08/12/2015   HCT 24.7* 08/12/2015   MCV 76.7* 08/12/2015   PLT 192 08/12/2015    --/--/A POS (01/03 1028)/RI  A/P    Post operative day 1.  Routine post op and postpartum care.  Expect d/c tomorrow.  Percocet for pain control. Pt asymptomatic from anemia, getting iron.

## 2015-08-12 NOTE — Addendum Note (Signed)
Addendum  created 08/12/15 [id number] by Earmon PhoenixValerie P Humza Tallerico, CRNA   Modules edited: Clinical Notes   Clinical Notes:  File: [id number]

## 2015-08-12 NOTE — Anesthesia Postprocedure Evaluation (Signed)
Anesthesia Post Note  Patient: Kari Wilkerson  Procedure(s) Performed: Procedure(s) (LRB): CESAREAN SECTION (N/A)  Patient location during evaluation: Mother Baby Anesthesia Type: Spinal Level of consciousness: awake and alert Pain management: pain level controlled Vital Signs Assessment: post-procedure vital signs reviewed and stable Respiratory status: spontaneous breathing Cardiovascular status: stable Postop Assessment: no headache, no backache, spinal receding and patient able to bend at knees Anesthetic complications: no    Last Vitals:  Filed Vitals:   08/11/15 2100 08/12/15 0500  BP: 98/42 88/34  Pulse: 68 64  Temp: 36.8 C 36.6 C  Resp: 20 18    Last Pain:  Filed Vitals:   08/12/15 0604  PainSc: 0-No pain                 Edison PaceWILKERSON,[name]

## 2015-08-13 MED ORDER — OXYCODONE-ACETAMINOPHEN 5-325 MG PO TABS: 1.0000 | ORAL_TABLET | Freq: Four times a day (QID) | ORAL | Status: DC | PRN

## 2015-08-13 MED ORDER — FERROUS SULFATE 325 (65 FE) MG PO TABS: 325.0000 mg | ORAL_TABLET | Freq: Two times a day (BID) | ORAL | Status: DC

## 2015-08-13 MED ORDER — IBUPROFEN 600 MG PO TABS: 600.0000 mg | ORAL_TABLET | Freq: Four times a day (QID) | ORAL | Status: DC

## 2015-08-13 NOTE — Progress Notes (Signed)
  Patient is eating, ambulating, voiding.  Pain control is good.  Lochia appropriate  Filed Vitals:   08/12/15 1612 08/12/15 1716 08/13/15 0006 08/13/15 0551  BP: 100/55 98/54  99/60  Pulse: 62 60  65  Temp: 97.7 F (36.5 C) 98.3 F (36.8 C)  98 F (36.7 C)  TempSrc:  Oral  Oral  Resp: 20 18  14   Height:   5\' 2"  (1.575 m)   Weight:   67.132 kg (148 lb)   SpO2: 98% 100%  100%    lNAD Abdomen:  soft, appropriate tenderness, incisions intact and without erythema or exudate ex:    no edema  Lab Results  Component Value Date   WBC 9.3 08/12/2015   HGB 7.6* 08/12/2015   HCT 24.7* 08/12/2015   MCV 76.7* 08/12/2015   PLT 192 08/12/2015    --/--/A POS (01/03 1028)/RI  A/P    Post operative day 2 s/p RCS.  Routine post op and postpartum care.    Pt asymptomatic from anemia, iron at discharge Meeting all goals, d/c to home today

## 2015-08-13 NOTE — Discharge Instructions (Signed)

## 2015-08-13 NOTE — Discharge Summary (Signed)
Obstetric Discharge Summary Reason for Admission: cesarean section Prenatal Procedures: none Intrapartum Procedures: cesarean: low cervical, transverse Postpartum Procedures: none Complications-Operative and Postpartum: none HEMOGLOBIN  Date Value Ref Range Status  08/12/2015 7.6* 12.0 - 15.0 g/dL Final   HCT  Date Value Ref Range Status  08/12/2015 24.7* 36.0 - 46.0 % Final    Physical Exam:  General: alert, cooperative and appears stated age 31Lochia: appropriate Uterine Fundus: firm Incision: healing well, no significant drainage DVT Evaluation: No evidence of DVT seen on physical exam.  Discharge Diagnoses: Term Pregnancy-delivered  Discharge Information: Date: 08/13/2015 Activity: pelvic rest Diet: routine Medications: PNV, Ibuprofen, Iron and Percocet Condition: stable Instructions: refer to practice specific booklet Discharge to: home Follow-up Information    Follow up with HORVATH,MICHELLE A, MD In 4 weeks.   Specialty:  Obstetrics and Gynecology   Why:  postpartum visit   Contact information:   [address] [place] [id number] [phone]       Newborn Data: Live born female  Birth Weight: 7 lb 12.7 oz (3535 g) APGAR: 8, 9  Home with mother.  Kari Wilkerson Kari Wilkerson 08/13/2015, 8:13 AM

## 2015-08-13 NOTE — Lactation Note (Signed)
This note was copied from the chart of Boy Tennis ShipLindsay O'Bryant. Lactation Consultation Note  Patient Name: Boy Tennis ShipLindsay O'Bryant ZOXWR'UToday's Date: 08/13/2015 Reason for consult: Follow-up assessment Baby 46 hours old. Mom reports that baby got off to a slow start, but nursing well now. Mom denies any nipple pain, and states that she feels her breasts becoming more heavy. Mom aware of OP/BFSG and LC phone line assistance after D/C.   Maternal Data    Feeding Feeding Type: Breast Fed  LATCH Score/Interventions                      Lactation Tools Discussed/Used     Consult Status Consult Status: PRN    Geralynn OchsWILLIARD, Herschell Virani 08/13/2015, 11:03 AM

## 2016-08-18 ENCOUNTER — Ambulatory Visit (INDEPENDENT_AMBULATORY_CARE_PROVIDER_SITE_OTHER): Payer: 59 | Admitting: Family Medicine

## 2016-08-18 ENCOUNTER — Encounter: Payer: Self-pay | Admitting: Family Medicine

## 2016-08-18 VITALS — BP 115/71 | HR 101 | Temp 102.3°F | Ht 62.0 in | Wt 107.4 lb

## 2016-08-18 DIAGNOSIS — J111 Influenza due to unidentified influenza virus with other respiratory manifestations: Secondary | ICD-10-CM

## 2016-08-18 LAB — VERITOR FLU A/B WAIVED
INFLUENZA A: POSITIVE — AB
Influenza B: NEGATIVE

## 2016-08-18 MED ORDER — OSELTAMIVIR PHOSPHATE 75 MG PO CAPS
75.0000 mg | ORAL_CAPSULE | Freq: Two times a day (BID) | ORAL | 0 refills | Status: DC
Start: 2016-08-18 — End: 2016-08-23

## 2016-08-18 NOTE — Progress Notes (Signed)
BP 115/71   Pulse (!) 101   Temp (!) 102.3 F (39.1 C) (Oral)   Ht 5\' 2"  (1.575 m)   Wt 107 lb 6 oz (48.7 kg)   BMI 19.64 kg/m    Subjective:    Patient ID: Kari Wilkerson, female    DOB: Feb 20, 1991, 26 y.o.   MRN: [id number]  HPI: Kari Wilkerson is a 26 y.o. female presenting on 08/18/2016 for Cough, runny nose, headache, body aches (began last night)   HPI Cough and congestion and body aches  Patient comes in today complaining of cough and congestion and runny nose and headache and body aches and fevers that just started last night. She does not know of any sick contacts that she has necessarily. She denies any shortness of breath or wheezing. She has used some Tylenol and ibuprofen. Her fever today is 102.3.  Relevant past medical, surgical, family and social history reviewed and updated as indicated. Interim medical history since our last visit reviewed. Allergies and medications reviewed and updated.  Review of Systems  Constitutional: Positive for chills and fever.  HENT: Positive for congestion, postnasal drip, rhinorrhea, sinus pressure and sore throat. Negative for ear discharge, ear pain and sneezing.   Eyes: Negative for pain, redness and visual disturbance.  Respiratory: Positive for cough. Negative for chest tightness and shortness of breath.   Cardiovascular: Negative for chest pain and leg swelling.  Genitourinary: Negative for difficulty urinating and dysuria.  Musculoskeletal: Positive for myalgias. Negative for back pain and gait problem.  Skin: Negative for rash.  Neurological: Negative for light-headedness and headaches.  Psychiatric/Behavioral: Negative for agitation and behavioral problems.  All other systems reviewed and are negative.   Per HPI unless specifically indicated above   Allergies as of 08/18/2016   No Known Allergies     Medication List       Accurate as of 08/18/16  4:37 PM. Always use your most recent med list.            oseltamivir 75 MG capsule Commonly known as:  TAMIFLU Take 1 capsule (75 mg total) by mouth 2 (two) times daily.          Objective:    BP 115/71   Pulse (!) 101   Temp (!) 102.3 F (39.1 C) (Oral)   Ht 5\' 2"  (1.575 m)   Wt 107 lb 6 oz (48.7 kg)   BMI 19.64 kg/m   Wt Readings from Last 3 Encounters:  08/18/16 107 lb 6 oz (48.7 kg)  08/13/15 148 lb (67.1 kg)  08/07/15 146 lb 8 oz (66.5 kg)    Physical Exam  Constitutional: She is oriented to person, place, and time. She appears well-developed and well-nourished. No distress.  HENT:  Right Ear: Tympanic membrane, external ear and ear canal normal.  Left Ear: Tympanic membrane, external ear and ear canal normal.  Nose: Mucosal edema and rhinorrhea present. No epistaxis. Right sinus exhibits no maxillary sinus tenderness and no frontal sinus tenderness. Left sinus exhibits no maxillary sinus tenderness and no frontal sinus tenderness.  Mouth/Throat: Uvula is midline and mucous membranes are normal. Posterior oropharyngeal edema and posterior oropharyngeal erythema present. No oropharyngeal exudate or tonsillar abscesses.  Eyes: Conjunctivae are normal.  Cardiovascular: Normal rate, regular rhythm, normal heart sounds and intact distal pulses.   No murmur heard. Pulmonary/Chest: Effort normal and breath sounds normal. No respiratory distress. She has no wheezes. She has no rales.  Musculoskeletal: Normal range of motion. She  exhibits no edema or tenderness.  Neurological: She is alert and oriented to person, place, and time. Coordination normal.  Skin: Skin is warm and dry. No rash noted. She is not diaphoretic.  Psychiatric: She has a normal mood and affect. Her behavior is normal.  Vitals reviewed.     Assessment & Plan:   Problem List Items Addressed This Visit    None    Visit Diagnoses    Influenza    -  Primary   Relevant Medications   oseltamivir (TAMIFLU) 75 MG capsule   Other Relevant Orders   Veritor Flu A/B  Waived       Follow up plan: Return if symptoms worsen or fail to improve.  Counseling provided for all of the vaccine components Orders Placed This Encounter  Procedures  . Veritor Flu A/B Waived    Arville CareJoshua Dettinger, MD [facility] 08/18/2016, 4:37 PM

## 2016-08-22 ENCOUNTER — Telehealth: Payer: Self-pay | Admitting: Nurse Practitioner

## 2016-08-23 ENCOUNTER — Ambulatory Visit (INDEPENDENT_AMBULATORY_CARE_PROVIDER_SITE_OTHER): Payer: 59 | Admitting: Family Medicine

## 2016-08-23 ENCOUNTER — Encounter: Payer: Self-pay | Admitting: Family Medicine

## 2016-08-23 VITALS — BP 110/73 | HR 80 | Temp 97.9°F | Ht 62.0 in | Wt 106.4 lb

## 2016-08-23 DIAGNOSIS — J111 Influenza due to unidentified influenza virus with other respiratory manifestations: Secondary | ICD-10-CM | POA: Diagnosis not present

## 2016-08-23 MED ORDER — ONDANSETRON HCL 4 MG PO TABS: 4.0000 mg | ORAL_TABLET | Freq: Three times a day (TID) | ORAL | 0 refills | Status: AC | PRN

## 2016-08-23 NOTE — Progress Notes (Signed)
   HPI  Patient presents today here with continued illness.  Patient was seen on 08/18/2016 and found to be influenza A positive. She states that she continues to have body aches and chills. She also complains of nausea with no emesis and dizziness described as unsteadiness. She has no room spinning sensation.  She denies cough or shortness of breath.  Her 2 children have also been found to have influenza. Her husband does not have a.  She's tolerating food and fluids normally.  PMH: Smoking status noted ROS: Per HPI  Objective: BP 110/73   Pulse 80   Temp 97.9 F (36.6 C) (Oral)   Ht 5\' 2"  (1.575 m)   Wt 106 lb 6.4 oz (48.3 kg)   BMI 19.46 kg/m  Gen: NAD, alert, cooperative with exam HEENT: NCAT, nares clear, oropharynx moist and clear, TMs within normal limits bilaterally CV: RRR, good S1/S2, no murmur Resp: CTABL, no wheezes, non-labored Ext: No edema, warm Neuro: Alert and oriented, No gross deficits  Assessment and plan:  # Influenza Symptoms consistent with influenza without complication Given Zofran for nausea Lung exam is clear and very reassuring, no concern for underlying pneumonia Low threshold for follow-up if symptoms do not improve as expected, discussed usual course of illness   Meds ordered this encounter  Medications  . ondansetron (ZOFRAN) 4 MG tablet    Sig: Take 1 tablet (4 mg total) by mouth every 8 (eight) hours as needed for nausea or vomiting.    Dispense:  20 tablet    Refill:  0    Murtis SinkSam Reeve Turnley, MD [facility] 08/23/2016, 1:12 PM

## 2016-08-23 NOTE — Telephone Encounter (Signed)
Patient was seen in office today for follow up.

## 2016-08-23 NOTE — Patient Instructions (Signed)
Great to see you!  Try zofran as needed  Drink lots of fluids, get as much rest as possible.    Influenza, Adult Influenza, more commonly known as "the flu," is a viral infection that primarily affects the respiratory tract. The respiratory tract includes organs that help you breathe, such as the lungs, nose, and throat. The flu causes many common cold symptoms, as well as a high fever and body aches. The flu spreads easily from person to person (is contagious). Getting a flu shot (influenza vaccination) every year is the best way to prevent influenza. What are the causes? Influenza is caused by a virus. You can catch the virus by:  Breathing in droplets from an infected person's cough or sneeze.  Touching something that was recently contaminated with the virus and then touching your mouth, nose, or eyes. What increases the risk? The following factors may make you more likely to get the flu:  Not cleaning your hands frequently with soap and water or alcohol-based hand sanitizer.  Having close contact with many people during cold and flu season.  Touching your mouth, eyes, or nose without washing or sanitizing your hands first.  Not drinking enough fluids or not eating a healthy diet.  Not getting enough sleep or exercise.  Being under a high amount of stress.  Not getting a yearly (annual) flu shot. You may be at a higher risk of complications from the flu, such as a severe lung infection (pneumonia), if you:  Are over the age of 24.  Are pregnant.  Have a weakened disease-fighting system (immune system). You may have a weakened immune system if you:  Have HIV or AIDS.  Are undergoing chemotherapy.  Aretaking medicines that reduce the activity of (suppress) the immune system.  Have a long-term (chronic) illness, such as heart disease, kidney disease, diabetes, or lung disease.  Have a liver disorder.  Are obese.  Have anemia. What are the signs or  symptoms? Symptoms of this condition typically last 4-10 days and may include:  Fever.  Chills.  Headache, body aches, or muscle aches.  Sore throat.  Cough.  Runny or congested nose.  Chest discomfort and cough.  Poor appetite.  Weakness or tiredness (fatigue).  Dizziness.  Nausea or vomiting. How is this diagnosed? This condition may be diagnosed based on your medical history and a physical exam. Your health care provider may do a nose or throat swab test to confirm the diagnosis. How is this treated? If influenza is detected early, you can be treated with antiviral medicine that can reduce the length of your illness and the severity of your symptoms. This medicine may be given by mouth (orally) or through an IV tube that is inserted in one of your veins. The goal of treatment is to relieve symptoms by taking care of yourself at home. This may include taking over-the-counter medicines, drinking plenty of fluids, and adding humidity to the air in your home. In some cases, influenza goes away on its own. Severe influenza or complications from influenza may be treated in a hospital. Follow these instructions at home:  Take over-the-counter and prescription medicines only as told by your health care provider.  Use a cool mist humidifier to add humidity to the air in your home. This can make breathing easier.  Rest as needed.  Drink enough fluid to keep your urine clear or pale yellow.  Cover your mouth and nose when you cough or sneeze.  Wash your hands with soap  and water often, especially after you cough or sneeze. If soap and water are not available, use hand sanitizer.  Stay home from work or school as told by your health care provider. Unless you are visiting your health care provider, try to avoid leaving home until your fever has been gone for 24 hours without the use of medicine.  Keep all follow-up visits as told by your health care provider. This is  important. How is this prevented?  Getting an annual flu shot is the best way to avoid getting the flu. You may get the flu shot in late summer, fall, or winter. Ask your health care provider when you should get your flu shot.  Wash your hands often or use hand sanitizer often.  Avoid contact with people who are sick during cold and flu season.  Eat a healthy diet, drink plenty of fluids, get enough sleep, and exercise regularly. Contact a health care provider if:  You develop new symptoms.  You have:  Chest pain.  Diarrhea.  A fever.  Your cough gets worse.  You produce more mucus.  You feel nauseous or you vomit. Get help right away if:  You develop shortness of breath or difficulty breathing.  Your skin or nails turn a bluish color.  You have severe pain or stiffness in your neck.  You develop a sudden headache or sudden pain in your face or ear.  You cannot stop vomiting. This information is not intended to replace advice given to you by your health care provider. Make sure you discuss any questions you have with your health care provider. Document Released: 07/22/2000 Document Revised: 12/31/2015 Document Reviewed: 05/19/2015 Elsevier Interactive Patient Education  2017 [employer].

## 2016-10-07 ENCOUNTER — Other Ambulatory Visit: Payer: Self-pay | Admitting: *Deleted

## 2016-10-07 MED ORDER — OSELTAMIVIR PHOSPHATE 75 MG PO CAPS: 75.0000 mg | ORAL_CAPSULE | Freq: Every day | ORAL | 0 refills | Status: AC

## 2017-08-14 ENCOUNTER — Telehealth: Payer: Self-pay | Admitting: Nurse Practitioner

## 2017-08-15 NOTE — Telephone Encounter (Signed)
lmtcb

## 2017-08-16 ENCOUNTER — Ambulatory Visit (INDEPENDENT_AMBULATORY_CARE_PROVIDER_SITE_OTHER): Payer: BLUE CROSS/BLUE SHIELD | Admitting: Family Medicine

## 2017-08-16 ENCOUNTER — Encounter: Payer: Self-pay | Admitting: Family Medicine

## 2017-08-16 VITALS — BP 122/81 | HR 86 | Temp 97.5°F | Ht 62.0 in | Wt 112.0 lb

## 2017-08-16 DIAGNOSIS — Z111 Encounter for screening for respiratory tuberculosis: Secondary | ICD-10-CM

## 2017-08-16 DIAGNOSIS — Z0000 Encounter for general adult medical examination without abnormal findings: Secondary | ICD-10-CM | POA: Diagnosis not present

## 2017-08-16 DIAGNOSIS — Z23 Encounter for immunization: Secondary | ICD-10-CM

## 2017-08-16 DIAGNOSIS — Z020 Encounter for examination for admission to educational institution: Secondary | ICD-10-CM

## 2017-08-16 DIAGNOSIS — Z8619 Personal history of other infectious and parasitic diseases: Secondary | ICD-10-CM

## 2017-08-16 NOTE — Patient Instructions (Signed)
You were given your flu shot today.  You were also given the PPD test today.  Please follow-up as scheduled for reading of your TB test.  I am checking the status of your immunity for varicella.  I should have this result by next week.  I will contact you with the result.  Tuberculin Skin Test Why am I having this test? Tuberculosis (TB) is a bacterial infection caused by Mycobacterium tuberculosis. Most people who are exposed to these bacteria have a strong enough defense (immune) system to prevent the bacteria from causing TB and developing symptoms. Their bodies prevent the germs from being active and making them sick (latent TB infection). However, if you have TB germs in your body and your immune system is weak, you can develop a TB infection. This can cause symptoms such as:  Night sweats.  Fever.  Weakness.  Weight loss.  A latent TB infection can also become active later in life if your immune system becomes weakened or compromised. You may have this test if your health care provider suspects that you have TB. You may also have this test to screen for TB if you are at risk for getting the disease. Those at increased risk include:  People who inject illegal drugs or share needles.  People with HIV or other diseases that affect immunity.  Health care workers.  People who live in high-risk communities, such as homeless shelters, nursing homes, and correctional facilities.  People who have been in contact with someone with TB.  People from countries where TB is more common.  If you are in a high-risk group, your health care provider may wish to screen for TB more often. This can help prevent the spread of the disease. Sometimes TB screening is required when starting a new job, such as becoming a Scientist, forensic or a Runner, broadcasting/film/video. Colleges or universities may require it of new students. What is being tested? A tuberculin skin test is the main test used to check for exposure to the  bacteria that can cause TB. The test checks for antibodies to the bacteria. Antibodies are proteins that your body produces to protect you from germs and other things that can make you sick. Your health care provider will inject a solution known as PPD (purified protein derivative) under the first layer of skin on your arm. This causes a blister-like bubble to form at the site. Your health care provider will then examine the site after a number of hours have passed to see if a reaction has occurred. How do I prepare for this test? There is no preparation required for this test. What do the results mean? Your test results will be reported as either negative or positive. If the tuberculin skin test produces a negative result, it is likely that you do not have TB and have not been exposed to the TB bacteria. If you or your health care provider suspects exposure, however, you may want to repeat the test a few weeks later. A blood test may also be used to check for TB. This is because you will not react to the tuberculin skin test until several weeks after exposure to TB bacteria. If you test positive to the tuberculin skin test, it is likely that you have been exposed to TB bacteria. The test does not distinguish between an active and a latent TB infection. A false-positive result can occur. A false-positive result for TB bacteria is incorrect because it indicates a condition or finding  is present when it is not. Talk to your health care provider to discuss your results, treatment options, and if necessary, the need for more tests. It is your responsibility to obtain your test results. Ask the lab or department performing the test when and how you will get your results. Talk with your health care provider if you have any questions about your results. Talk with your health care provider to discuss your results, treatment options, and if necessary, the need for more tests. Talk with your health care provider  if you have any questions about your results. This information is not intended to replace advice given to you by your health care provider. Make sure you discuss any questions you have with your health care provider. Document Released: 05/04/2005 Document Revised: 03/27/2016 Document Reviewed: 11/18/2013 Elsevier Interactive Patient Education  [employer]2018 Elsevier Inc.

## 2017-08-16 NOTE — Progress Notes (Signed)
Subjective: CC: Need for school physical PCP: Bennie PieriniMartin, Mary-Margaret, FNP WGN:[id number]:Kari T Carita PianO'Bryant is a 27 y.o. female presenting to clinic today for:  1.  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  She notes that she needs verification of immunity against chickenpox, the influenza vaccine and PPD.  She is otherwise up-to-date on vaccines.  She voices no concerns today except for occasional eye twitching in the right eye when she has been reading for a long time.    Denies visual disturbance, including blurry vision, double vision or loss of vision. Denies changes in hearing.   Denies sore throat, difficulty swallowing, voice change. Denies chest pain, shortness of breath. Denies breast masses, nipple discharge.  Denies abdominal pain, nausea, vomiting, constipation, diarrhea, hematochezia. Denies dysuria, hematuria, abnormal vaginal discharge or bleeding. Denies new skin lesions or moles that are changing shape, size or texture. Denies myalgias, arthralgias, weakness. Denies numbness, tingling. Denies anxiety, depression.  LMP 08/01/2017.  Uses barrier method for contraception.  Last Pap smear was 2 years ago.  No history of abnormal Pap.  No Known Allergies Past Medical History:  Diagnosis Date  . Ovarian cyst   . Postpartum care following cesarean delivery (11/27) 07/04/2014    Current Outpatient Medications:  .  ondansetron (ZOFRAN) 4 MG tablet, Take 1 tablet (4 mg total) by mouth every 8 (eight) hours as needed for nausea or vomiting., Disp: 20 tablet, Rfl: 0 .  oseltamivir (TAMIFLU) 75 MG capsule, Take 1 capsule (75 mg total) by mouth daily., Disp: 10 capsule, Rfl: 0 Social History   Socioeconomic History  . Marital status: Married    Spouse name: Not on file  . Number of children: Not on file  . Years of education: Not on file  . Highest education level: Not on file  Social Needs  . Financial  resource strain: Not on file  . Food insecurity - worry: Not on file  . Food insecurity - inability: Not on file  . Transportation needs - medical: Not on file  . Transportation needs - non-medical: Not on file  Occupational History  . Not on file  Tobacco Use  . Smoking status: Never Smoker  . Smokeless tobacco: Never Used  Substance and Sexual Activity  . Alcohol use: No  . Drug use: No  . Sexual activity: Yes    Birth control/protection: None  Other Topics Concern  . Not on file  Social History Narrative  . Not on file   Family History  Problem Relation Age of Onset  . Hypertension Father     Objective: Office vital signs reviewed. BP 122/81   Pulse 86   Temp (!) 97.5 F (36.4 C) (Oral)   Ht 5\' 2"  (1.575 m)   Wt 112 lb (50.8 kg)   BMI 20.49 kg/m   Physical Examination:  General: Awake, alert, well nourished, well appearing, No acute distress HEENT: Normal    Neck: No masses palpated. No lymphadenopathy    Ears: Tympanic membranes intact, normal light reflex, no erythema, no bulging    Eyes: PERRLA, extraocular membranes intact, sclera white, no ocular discharge    Nose: nasal turbinates moist, no nasal discharge    Throat: moist mucus membranes, no erythema, no tonsillar exudate.  Airway is patent Cardio: regular rate and rhythm, S1S2 heard, no murmurs appreciated Pulm: clear to auscultation bilaterally, no wheezes, rhonchi or rales; normal work of breathing on room air Extremities: warm, well perfused,  No edema, cyanosis or clubbing; +2 pulses bilaterally MSK: normal gait and normal station Skin: dry; intact; no rashes or lesions Neuro: normal Strength and light touch sensation grossly intact, no focal neurologic deficits. Psych: Mood stable, speech normal, affect appropriate, good eye contact.  Assessment/ Plan: 27 y.o. female   1. School physical exam Well-appearing female.  No focal findings.  She is due for her Pap smear next year.  GU exam not  performed today.  Patient declined additional blood labs, including renal and liver function testing.  Varicella titer and PPD test performed today. - Varicella Zoster Abs, IgG/IgM - TB Skin Test  2. History of chicken pox - Varicella Zoster Abs, IgG/IgM  3. Screening for tuberculosis Patient to return for PPD read as scheduled. - TB Skin Test  4. Need for immunization against influenza Influenza vaccine administered today.  Patient did become slightly lightheaded after administration of influenza.  She was monitored here in office and provided fluids.  She did well and was discharged home. - Flu Vaccine QUAD 36+ mos IM   Orders Placed This Encounter  Procedures  . Flu Vaccine QUAD 36+ mos IM  . Varicella Zoster Abs, IgG/IgM  . TB Skin Test    Order Specific Question:   Has patient ever tested positive?    Answer:   No     Raliegh Ip, DO [facility] [phone]

## 2017-08-16 NOTE — Telephone Encounter (Signed)
Pt picked up shot record  °

## 2017-08-17 LAB — VARICELLA ZOSTER ABS, IGG/IGM: Varicella zoster IgG: 2425 index (ref >165)

## 2017-08-18 LAB — TB SKIN TEST
Induration: 0 mm
TB Skin Test: NEGATIVE

## 2017-08-21 ENCOUNTER — Ambulatory Visit: Payer: 59 | Admitting: Nurse Practitioner

## 2017-08-22 ENCOUNTER — Ambulatory Visit: Payer: 59 | Admitting: Nurse Practitioner

## 2017-08-25 ENCOUNTER — Other Ambulatory Visit: Payer: BLUE CROSS/BLUE SHIELD

## 2017-08-25 ENCOUNTER — Encounter (INDEPENDENT_AMBULATORY_CARE_PROVIDER_SITE_OTHER): Payer: BLUE CROSS/BLUE SHIELD | Admitting: Family Medicine

## 2017-08-25 NOTE — Progress Notes (Deleted)
    Subjective: CC: need drug screen for school PCP: Bennie PieriniMartin, Mary-Margaret, FNP ZOX:[id number]:Kari T Carita PianO'Bryant is a 27 y.o. female presenting to clinic today for:  1. ***   ROS: Per HPI  No Known Allergies Past Medical History:  Diagnosis Date  . Ovarian cyst   . Postpartum care following cesarean delivery (11/27) 07/04/2014    Current Outpatient Medications:  .  ondansetron (ZOFRAN) 4 MG tablet, Take 1 tablet (4 mg total) by mouth every 8 (eight) hours as needed for nausea or vomiting., Disp: 20 tablet, Rfl: 0 .  oseltamivir (TAMIFLU) 75 MG capsule, Take 1 capsule (75 mg total) by mouth daily., Disp: 10 capsule, Rfl: 0 Social History   Socioeconomic History  . Marital status: Legally Separated    Spouse name: Not on file  . Number of children: Not on file  . Years of education: Not on file  . Highest education level: Not on file  Social Needs  . Financial resource strain: Not on file  . Food insecurity - worry: Not on file  . Food insecurity - inability: Not on file  . Transportation needs - medical: Not on file  . Transportation needs - non-medical: Not on file  Occupational History  . Not on file  Tobacco Use  . Smoking status: Never Smoker  . Smokeless tobacco: Never Used  Substance and Sexual Activity  . Alcohol use: No  . Drug use: No  . Sexual activity: Yes    Birth control/protection: None  Other Topics Concern  . Not on file  Social History Narrative  . Not on file   Family History  Problem Relation Age of Onset  . Hypertension Father     Objective: Office vital signs reviewed. There were no vitals taken for this visit.  Physical Examination:  General: Awake, alert, *** nourished, No acute distress HEENT: Normal    Neck: No masses palpated. No lymphadenopathy    Ears: Tympanic membranes intact, normal light reflex, no erythema, no bulging    Eyes: PERRLA, extraocular membranes intact, sclera ***    Nose: nasal turbinates moist, *** nasal discharge   Throat: moist mucus membranes, no erythema, *** tonsillar exudate.  Airway is patent Cardio: regular rate and rhythm, S1S2 heard, no murmurs appreciated Pulm: clear to auscultation bilaterally, no wheezes, rhonchi or rales; normal work of breathing on room air GI: soft, non-tender, non-distended, bowel sounds present x4, no hepatomegaly, no splenomegaly, no masses GU: external vaginal tissue ***, cervix ***, *** punctate lesions on cervix appreciated, *** discharge from cervical os, *** bleeding, *** cervical motion tenderness, *** abdominal/ adnexal masses Extremities: warm, well perfused, No edema, cyanosis or clubbing; +*** pulses bilaterally MSK: *** gait and *** station Skin: dry; intact; no rashes or lesions Neuro: *** Strength and light touch sensation grossly intact, *** DTRs ***/4  Assessment/ Plan: 27 y.o. female   ***  No orders of the defined types were placed in this encounter.  No orders of the defined types were placed in this encounter.    Raliegh IpAshly M Ann Bohne, DO [facility] [phone]

## 2017-08-30 NOTE — Progress Notes (Signed)
error 

## 2018-05-11 ENCOUNTER — Ambulatory Visit: Payer: BLUE CROSS/BLUE SHIELD | Admitting: Family

## 2018-05-15 ENCOUNTER — Encounter: Payer: Self-pay | Admitting: Nurse Practitioner

## 2018-08-21 ENCOUNTER — Emergency Department
Admission: EM | Admit: 2018-08-21 | Discharge: 2018-08-21 | Disposition: A | Discharge status: Home / Self Care | Payer: Self-pay | Source: Home / Self Care

## 2018-08-21 ENCOUNTER — Other Ambulatory Visit: Payer: Self-pay

## 2018-08-21 DIAGNOSIS — G43809 Other migraine, not intractable, without status migrainosus: Secondary | ICD-10-CM

## 2018-08-21 DIAGNOSIS — J0101 Acute recurrent maxillary sinusitis: Secondary | ICD-10-CM

## 2018-08-21 MED ORDER — BUTALBITAL-APAP-CAFFEINE 50-325-40 MG PO TABS: 1.0000 | ORAL_TABLET | Freq: Four times a day (QID) | ORAL | 0 refills | Status: AC | PRN

## 2018-08-21 MED ORDER — FLUTICASONE PROPIONATE 50 MCG/ACT NA SUSP: 2.0000 | Freq: Every day | NASAL | 0 refills | Status: AC

## 2018-08-21 MED ORDER — AZITHROMYCIN 250 MG PO TABS: ORAL_TABLET | ORAL | 0 refills | Status: AC

## 2018-08-21 NOTE — ED Triage Notes (Signed)
Pt having facial pressure, headache, left ear pain. Denies nasal drainage.

## 2018-08-21 NOTE — Discharge Instructions (Addendum)
Use Flonase 2 sprays each nostril twice daily to open up sinuses.  After about 4 days decrease to once daily.  Make plenty of fluids to stay well-hydrated  Take the azithromycin 2 pills initially, then 1 daily for 4 days for antibiotic  Take the generic Fioricet 1 or 2 pills every 6 hours if needed for headache  If continue to have pain or recurring symptoms in the sinus region you may need to be rechecked and consideration of doing x-ray studies or a scan of your sinuses since you keep having the problems in the same area.  Return as needed.

## 2018-08-21 NOTE — ED Provider Notes (Signed)
[facility]    CSN: [id number] Arrival date & time: 08/21/18  0947     History   Chief Complaint Chief Complaint  Patient presents with  . Facial Pain    HPI Kari Wilkerson is a 28 y.o. female.   HPI For about 5 days patient has been having left maxillary sinus pressure.  Then developed a pain on the left side of her face and forehead consistent with her migraine headaches.  She has persisted having the tenderness in her sinus area.  She is not blowing out a lot of mucus.  She does not smoke.  Has not gotten a flu shot.  Works as a LawyerCNA.  I strongly urged her to get one but she does not want one. Past Medical History:  Diagnosis Date  . Ovarian cyst   . Postpartum care following cesarean delivery (11/27) 07/04/2014    Patient Active Problem List   Diagnosis Date Noted  . Postoperative state 08/11/2015  . Acute blood loss anemia 07/07/2014  . Postpartum care following cesarean delivery (11/27) 07/04/2014    Past Surgical History:  Procedure Laterality Date  . CESAREAN SECTION N/A 07/04/2014   Procedure: CESAREAN SECTION;  Surgeon: Robley FriesVaishali R Mody, MD;  Location: [facility];  Service: Obstetrics;  Laterality: N/A;  . CESAREAN SECTION N/A 08/11/2015   Procedure: CESAREAN SECTION;  Surgeon: Carrington ClampMichelle Horvath, MD;  Location: [facility];  Service: Obstetrics;  Laterality: N/A;  . WISDOM TOOTH EXTRACTION      OB History    Gravida  2   Para  2   Term  2   Preterm      AB      Living  1     SAB      TAB      Ectopic      Multiple  0   Live Births  1            Home Medications    Prior to Admission medications   Medication Sig Start Date End Date Taking? Authorizing Provider  ondansetron (ZOFRAN) 4 MG tablet Take 1 tablet (4 mg total) by mouth every 8 (eight) hours as needed for nausea or vomiting. 08/23/16   Elenora GammaBradshaw, Samuel L, MD  oseltamivir (TAMIFLU) 75 MG capsule Take 1 capsule (75 mg total) by mouth daily. 10/07/16   Johna SheriffVincent, Carol L, MD      Family History Family History  Problem Relation Age of Onset  . Hypertension Father     Social History Social History   Tobacco Use  . Smoking status: Never Smoker  . Smokeless tobacco: Never Used  Substance Use Topics  . Alcohol use: No  . Drug use: No     Allergies   Patient has no known allergies.   Review of Systems Review of Systems Headache as noted above Constitutional: Feels worse today than yesterday HEENT: Not much drainage from her nose.  Eyes unremarkable.  A little left ear pain.  No sore throat. Respiratory: Minimal cough Cardiovascular: Unremarkable Last menstrual cycle 3-week and 1/2 weeks ago  Physical Exam Triage Vital Signs ED Triage Vitals  Enc Vitals Group     BP 08/21/18 1005 131/85     Pulse Rate 08/21/18 1005 76     Resp 08/21/18 1005 18     Temp 08/21/18 1005 97.8 F (36.6 C)     Temp Source 08/21/18 1005 Oral     SpO2 08/21/18 1005 100 %     Weight  08/21/18 1006 118 lb (53.5 kg)     Height 08/21/18 1006 5\' 3"  (1.6 m)     Head Circumference --      Peak Flow --      Pain Score 08/21/18 1006 3     Pain Loc --      Pain Edu? --      Excl. in GC? --    No data found.  Updated Vital Signs BP 131/85 (BP Location: Right Arm)   Pulse 76   Temp 97.8 F (36.6 C) (Oral)   Resp 18   Ht 5\' 3"  (1.6 m)   Wt 53.5 kg   LMP 08/03/2018   SpO2 100%   BMI 20.90 kg/m   Visual Acuity Right Eye Distance:   Left Eye Distance:   Bilateral Distance:    Right Eye Near:   Left Eye Near:    Bilateral Near:     Physical Exam No major distress.  Wears a cap but denies photophobia.  TMs are normal.  Tender left maxillary sinus area.  Nose clear.  Throat clear.  Neck supple without significant nodes.  Chest is clear to auscultation.  Heart regular without murmur.  UC Treatments / Results  Labs (all labs ordered are listed, but only abnormal results are displayed) Labs Reviewed - No data to display  EKG None  Radiology No results  found.  Procedures Procedures (including critical care time) None Medications Ordered in UC Medications - No data to display  Initial Impression / Assessment and Plan / UC Course  I have reviewed the triage vital signs and the nursing notes.  Pertinent labs & imaging results that were available during my care of the patient were reviewed by me and considered in my medical decision making (see chart for details).     Left maxillary sinus pain and pressure; probable migraines Final Clinical Impressions(s) / UC Diagnoses   Final diagnoses:  None   Discharge Instructions   None    ED Prescriptions    None     Controlled Substance Prescriptions [place] Controlled Substance Registry consulted?No  Actually yes I did consult it and she has no risk on the PD MP   Peyton Najjar, MD 08/21/18 1048

## 2018-10-02 ENCOUNTER — Other Ambulatory Visit: Payer: Self-pay | Admitting: Otolaryngology

## 2018-10-02 ENCOUNTER — Ambulatory Visit
Admission: RE | Admit: 2018-10-02 | Discharge: 2018-10-02 | Disposition: A | Discharge status: Home / Self Care | Payer: No Typology Code available for payment source | Source: Ambulatory Visit | Attending: Otolaryngology | Admitting: Otolaryngology

## 2018-10-02 DIAGNOSIS — J0120 Acute ethmoidal sinusitis, unspecified: Secondary | ICD-10-CM

## 2018-10-02 DIAGNOSIS — J0110 Acute frontal sinusitis, unspecified: Secondary | ICD-10-CM

## 2020-09-12 IMAGING — CR DG SINUSES 1-2V
2 series · 2 of 2 positions shown · non-contrast
Comparison: None.

CLINICAL DATA: Left maxillary pain.

EXAM:
PARANASAL SINUSES - 1-2 VIEW

[w waters *]
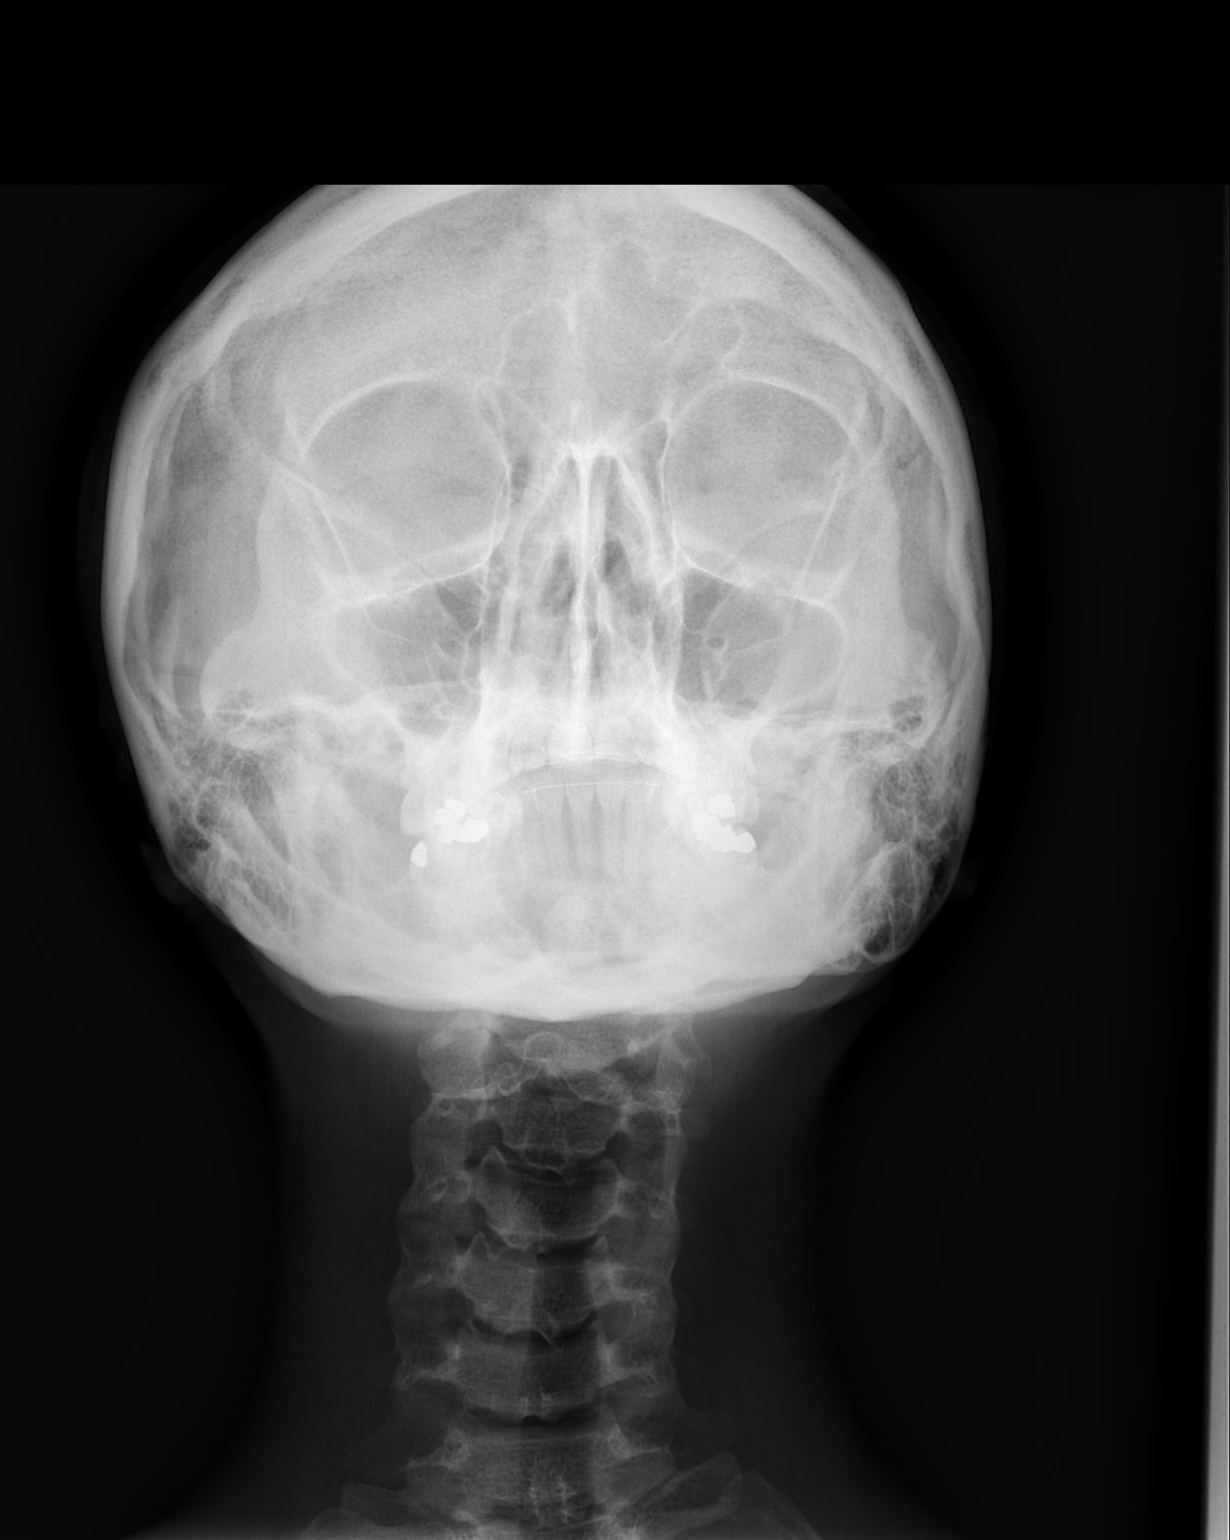

[w skull lat *]
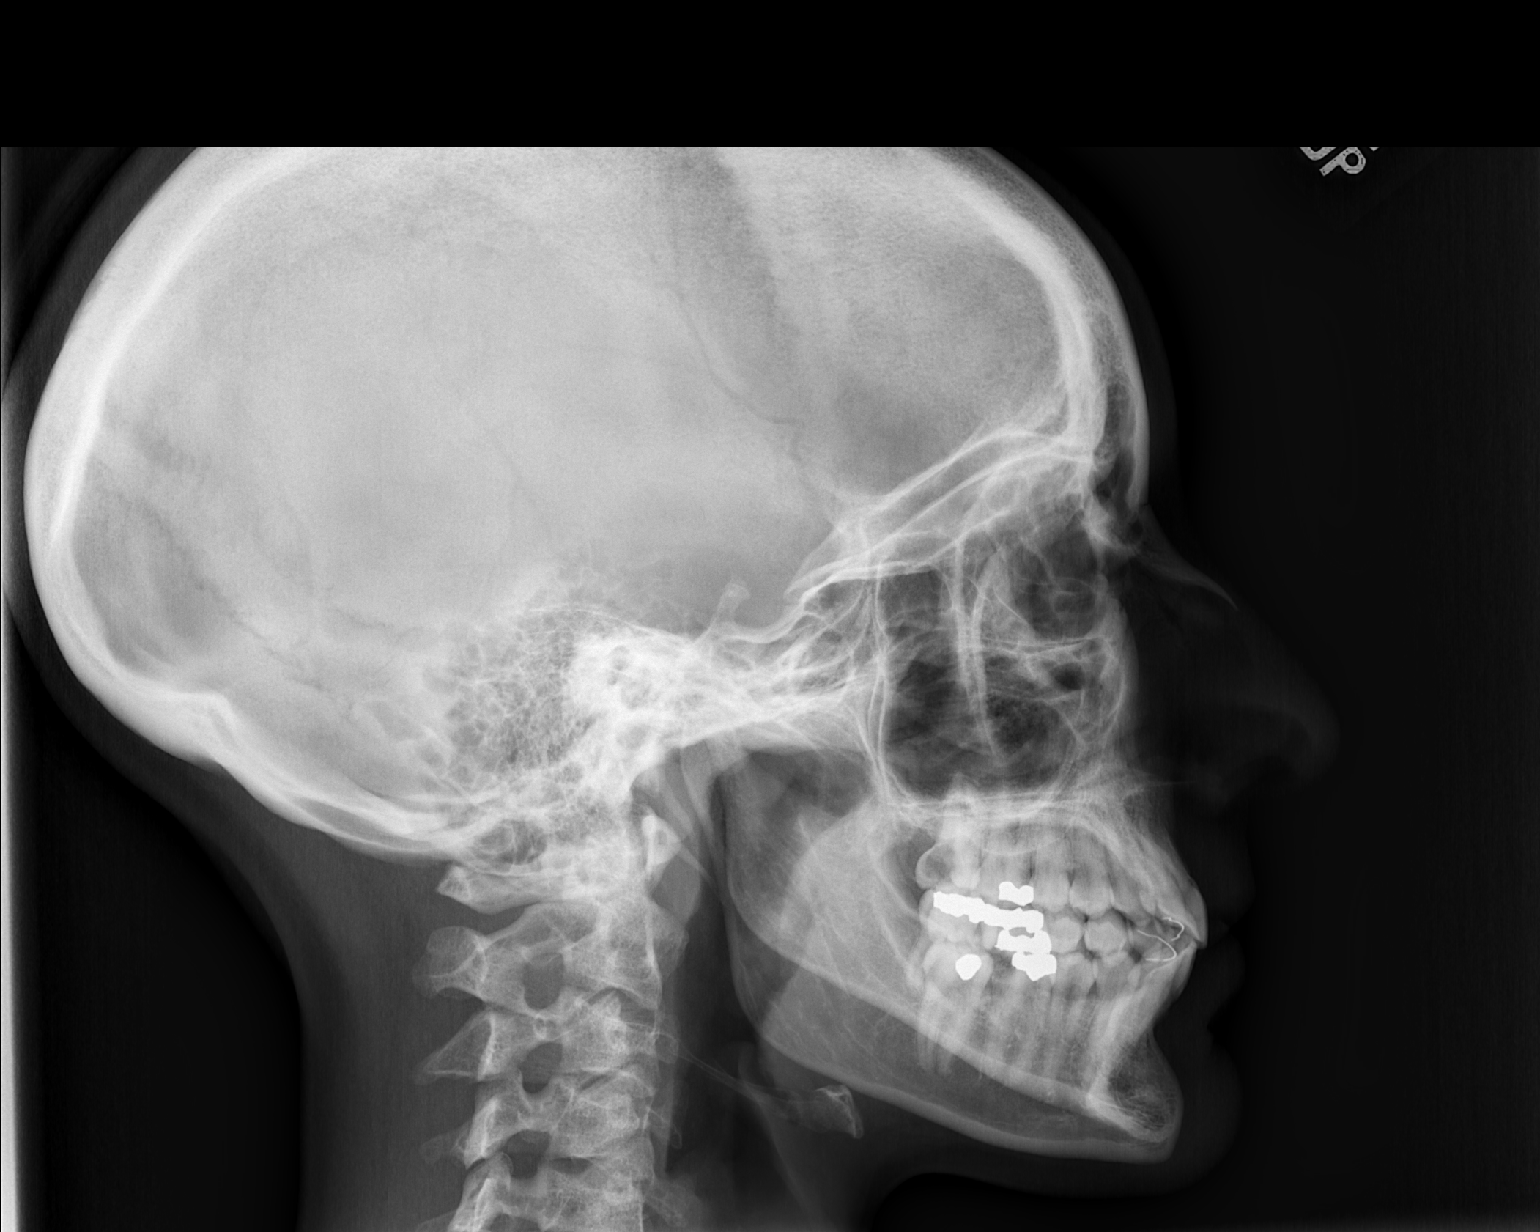

[2 of 2 positions shown; findings below may reference images not displayed]

FINDINGS: The paranasal sinus are aerated. There is no evidence of sinus
opacification air-fluid levels or mucosal thickening. No significant
bone abnormalities are seen.
IMPRESSION: Negative.

## 2023-12-18 DIAGNOSIS — Z3A12 12 weeks gestation of pregnancy: Secondary | ICD-10-CM | POA: Diagnosis not present

## 2023-12-18 DIAGNOSIS — Z3201 Encounter for pregnancy test, result positive: Secondary | ICD-10-CM | POA: Diagnosis not present

## 2023-12-18 DIAGNOSIS — O219 Vomiting of pregnancy, unspecified: Secondary | ICD-10-CM | POA: Diagnosis not present

## 2023-12-18 DIAGNOSIS — N9089 Other specified noninflammatory disorders of vulva and perineum: Secondary | ICD-10-CM | POA: Diagnosis not present

## 2023-12-18 DIAGNOSIS — Z3689 Encounter for other specified antenatal screening: Secondary | ICD-10-CM | POA: Diagnosis not present

## 2023-12-18 DIAGNOSIS — N912 Amenorrhea, unspecified: Secondary | ICD-10-CM | POA: Diagnosis not present

## 2023-12-18 DIAGNOSIS — O34219 Maternal care for unspecified type scar from previous cesarean delivery: Secondary | ICD-10-CM | POA: Diagnosis not present

## 2023-12-18 DIAGNOSIS — O3680X0 Pregnancy with inconclusive fetal viability, not applicable or unspecified: Secondary | ICD-10-CM | POA: Diagnosis not present

## 2023-12-18 DIAGNOSIS — N898 Other specified noninflammatory disorders of vagina: Secondary | ICD-10-CM | POA: Diagnosis not present

## 2023-12-23 LAB — PANORAMA PRENATAL TEST FULL PANEL:PANORAMA TEST PLUS 5 ADDITIONAL MICRODELETIONS: FETAL FRACTION: 12.8

## 2024-01-22 DIAGNOSIS — Z3689 Encounter for other specified antenatal screening: Secondary | ICD-10-CM | POA: Diagnosis not present

## 2024-02-23 DIAGNOSIS — Z3689 Encounter for other specified antenatal screening: Secondary | ICD-10-CM | POA: Diagnosis not present

## 2024-02-23 DIAGNOSIS — Z363 Encounter for antenatal screening for malformations: Secondary | ICD-10-CM | POA: Diagnosis not present

## 2024-02-23 DIAGNOSIS — Z3A22 22 weeks gestation of pregnancy: Secondary | ICD-10-CM | POA: Diagnosis not present

## 2024-03-21 DIAGNOSIS — Z23 Encounter for immunization: Secondary | ICD-10-CM | POA: Diagnosis not present

## 2024-03-21 DIAGNOSIS — Z3482 Encounter for supervision of other normal pregnancy, second trimester: Secondary | ICD-10-CM | POA: Diagnosis not present

## 2024-03-21 DIAGNOSIS — Z3689 Encounter for other specified antenatal screening: Secondary | ICD-10-CM | POA: Diagnosis not present

## 2024-06-13 DIAGNOSIS — Z3A37 37 weeks gestation of pregnancy: Secondary | ICD-10-CM | POA: Diagnosis not present

## 2024-06-13 DIAGNOSIS — Z3A36 36 weeks gestation of pregnancy: Secondary | ICD-10-CM | POA: Diagnosis not present

## 2024-06-13 DIAGNOSIS — Z362 Encounter for other antenatal screening follow-up: Secondary | ICD-10-CM | POA: Diagnosis not present

## 2024-06-13 DIAGNOSIS — O26843 Uterine size-date discrepancy, third trimester: Secondary | ICD-10-CM | POA: Diagnosis not present

## 2024-06-17 DIAGNOSIS — Z3689 Encounter for other specified antenatal screening: Secondary | ICD-10-CM | POA: Diagnosis not present

## 2024-06-21 DIAGNOSIS — Z3A39 39 weeks gestation of pregnancy: Secondary | ICD-10-CM | POA: Diagnosis not present

## 2024-06-21 DIAGNOSIS — O34211 Maternal care for low transverse scar from previous cesarean delivery: Secondary | ICD-10-CM | POA: Diagnosis not present

## 2024-06-21 DIAGNOSIS — D62 Acute posthemorrhagic anemia: Secondary | ICD-10-CM | POA: Diagnosis not present

## 2024-06-21 DIAGNOSIS — O9081 Anemia of the puerperium: Secondary | ICD-10-CM | POA: Diagnosis not present

## 2024-06-21 DIAGNOSIS — O34219 Maternal care for unspecified type scar from previous cesarean delivery: Secondary | ICD-10-CM | POA: Diagnosis not present
